# Patient Record
Sex: Female | Born: 1940 | Race: White | Hispanic: No | Marital: Married | Smoking: Never smoker
Health system: Southern US, Community
[De-identification: ages and names within clinical notes are randomized; demographics above are authoritative.]

## PROBLEM LIST (undated history)

## (undated) DIAGNOSIS — M719 Bursopathy, unspecified: Secondary | ICD-10-CM

## (undated) DIAGNOSIS — M1990 Unspecified osteoarthritis, unspecified site: Secondary | ICD-10-CM

## (undated) DIAGNOSIS — I10 Essential (primary) hypertension: Secondary | ICD-10-CM

## (undated) DIAGNOSIS — E785 Hyperlipidemia, unspecified: Secondary | ICD-10-CM

## (undated) DIAGNOSIS — Z8619 Personal history of other infectious and parasitic diseases: Secondary | ICD-10-CM

## (undated) DIAGNOSIS — K5732 Diverticulitis of large intestine without perforation or abscess without bleeding: Secondary | ICD-10-CM

## (undated) DIAGNOSIS — J45909 Unspecified asthma, uncomplicated: Secondary | ICD-10-CM

## (undated) DIAGNOSIS — K219 Gastro-esophageal reflux disease without esophagitis: Secondary | ICD-10-CM

## (undated) DIAGNOSIS — M5137 Other intervertebral disc degeneration, lumbosacral region: Secondary | ICD-10-CM

## (undated) DIAGNOSIS — N3000 Acute cystitis without hematuria: Secondary | ICD-10-CM

## (undated) DIAGNOSIS — L723 Sebaceous cyst: Secondary | ICD-10-CM

## (undated) DIAGNOSIS — M67919 Unspecified disorder of synovium and tendon, unspecified shoulder: Secondary | ICD-10-CM

## (undated) DIAGNOSIS — J309 Allergic rhinitis, unspecified: Secondary | ICD-10-CM

## (undated) HISTORY — DX: Bursopathy, unspecified: M71.9

## (undated) HISTORY — DX: Acute cystitis without hematuria: N30.00

## (undated) HISTORY — DX: Gastro-esophageal reflux disease without esophagitis: K21.9

## (undated) HISTORY — DX: Unspecified osteoarthritis, unspecified site: M19.90

## (undated) HISTORY — DX: Unspecified disorder of synovium and tendon, unspecified shoulder: M67.919

## (undated) HISTORY — PX: CHOLECYSTECTOMY: SHX55

## (undated) HISTORY — DX: Sebaceous cyst: L72.3

## (undated) HISTORY — DX: Unspecified asthma, uncomplicated: J45.909

## (undated) HISTORY — DX: Essential (primary) hypertension: I10

## (undated) HISTORY — PX: OTHER SURGICAL HISTORY: SHX169

## (undated) HISTORY — DX: Personal history of other infectious and parasitic diseases: Z86.19

## (undated) HISTORY — DX: Diverticulitis of large intestine without perforation or abscess without bleeding: K57.32

## (undated) HISTORY — DX: Hyperlipidemia, unspecified: E78.5

## (undated) HISTORY — DX: Other intervertebral disc degeneration, lumbosacral region: M51.37

## (undated) HISTORY — PX: ABDOMINAL HYSTERECTOMY: SHX81

## (undated) HISTORY — DX: Allergic rhinitis, unspecified: J30.9

---

## 1998-04-06 ENCOUNTER — Ambulatory Visit (HOSPITAL_COMMUNITY): Admission: RE | Admit: 1998-04-06 | Discharge: 1998-04-06 | Payer: Self-pay | Admitting: Gastroenterology

## 2000-04-16 ENCOUNTER — Other Ambulatory Visit: Admission: RE | Admit: 2000-04-16 | Discharge: 2000-04-16 | Payer: Self-pay | Admitting: Obstetrics and Gynecology

## 2001-12-28 ENCOUNTER — Encounter: Payer: Self-pay | Admitting: Family Medicine

## 2001-12-28 ENCOUNTER — Encounter: Admission: RE | Admit: 2001-12-28 | Discharge: 2001-12-28 | Payer: Self-pay | Admitting: Family Medicine

## 2004-04-05 ENCOUNTER — Ambulatory Visit: Payer: Self-pay | Admitting: Family Medicine

## 2004-04-12 ENCOUNTER — Ambulatory Visit: Payer: Self-pay | Admitting: Family Medicine

## 2004-05-23 ENCOUNTER — Ambulatory Visit: Payer: Self-pay | Admitting: Family Medicine

## 2004-11-09 ENCOUNTER — Ambulatory Visit: Payer: Self-pay | Admitting: Family Medicine

## 2005-03-15 ENCOUNTER — Ambulatory Visit: Payer: Self-pay | Admitting: Family Medicine

## 2005-03-18 ENCOUNTER — Ambulatory Visit: Payer: Self-pay | Admitting: Family Medicine

## 2005-03-21 ENCOUNTER — Ambulatory Visit: Payer: Self-pay | Admitting: Family Medicine

## 2005-06-14 ENCOUNTER — Ambulatory Visit: Payer: Self-pay | Admitting: Family Medicine

## 2005-06-21 ENCOUNTER — Ambulatory Visit: Payer: Self-pay | Admitting: Family Medicine

## 2005-11-25 ENCOUNTER — Ambulatory Visit: Payer: Self-pay | Admitting: Family Medicine

## 2006-01-22 ENCOUNTER — Ambulatory Visit: Payer: Self-pay | Admitting: Family Medicine

## 2006-01-22 LAB — CONVERTED CEMR LAB
ALT: 27 units/L (ref 0–40)
AST: 24 units/L (ref 0–37)
Albumin: 3.7 g/dL (ref 3.5–5.2)
Alkaline Phosphatase: 79 units/L (ref 39–117)
Bilirubin, Direct: 0.1 mg/dL (ref 0.0–0.3)
Chol/HDL Ratio, serum: 3.6
Cholesterol: 195 mg/dL (ref 0–200)
HDL: 54.4 mg/dL (ref >39.0)
LDL Cholesterol: 118 mg/dL — ABNORMAL HIGH (ref 0–99)
Total Bilirubin: 0.9 mg/dL (ref 0.3–1.2)
Total Protein: 6.4 g/dL (ref 6.0–8.3)
Triglyceride fasting, serum: 112 mg/dL (ref 0–149)
VLDL: 22 mg/dL (ref 0–40)

## 2006-02-18 ENCOUNTER — Encounter: Payer: Self-pay | Admitting: Family Medicine

## 2006-02-18 LAB — CONVERTED CEMR LAB

## 2006-02-26 ENCOUNTER — Ambulatory Visit: Payer: Self-pay | Admitting: Family Medicine

## 2006-06-03 ENCOUNTER — Ambulatory Visit: Payer: Self-pay | Admitting: Family Medicine

## 2006-06-03 LAB — CONVERTED CEMR LAB
ALT: 29 units/L (ref 0–40)
AST: 28 units/L (ref 0–37)
Albumin: 3.7 g/dL (ref 3.5–5.2)
Alkaline Phosphatase: 76 units/L (ref 39–117)
BUN: 13 mg/dL (ref 6–23)
Basophils Absolute: 0 10*3/uL (ref 0.0–0.1)
Basophils Relative: 0.5 % (ref 0.0–1.0)
Bilirubin, Direct: 0.1 mg/dL (ref 0.0–0.3)
CO2: 32 meq/L (ref 19–32)
Calcium: 9.9 mg/dL (ref 8.4–10.5)
Chloride: 106 meq/L (ref 96–112)
Cholesterol: 198 mg/dL (ref 0–200)
Creatinine, Ser: 0.8 mg/dL (ref 0.4–1.2)
Eosinophils Absolute: 0.2 10*3/uL (ref 0.0–0.6)
Eosinophils Relative: 3.8 % (ref 0.0–5.0)
GFR calc Af Amer: 93 mL/min
GFR calc non Af Amer: 77 mL/min
Glucose, Bld: 94 mg/dL (ref 70–99)
HCT: 42.7 % (ref 36.0–46.0)
HDL: 55 mg/dL (ref >39.0)
Hemoglobin: 14.8 g/dL (ref 12.0–15.0)
LDL Cholesterol: 114 mg/dL — ABNORMAL HIGH (ref 0–99)
Lymphocytes Relative: 41.9 % (ref 12.0–46.0)
MCHC: 34.7 g/dL (ref 30.0–36.0)
MCV: 83.7 fL (ref 78.0–100.0)
Monocytes Absolute: 0.5 10*3/uL (ref 0.2–0.7)
Monocytes Relative: 9 % (ref 3.0–11.0)
Neutro Abs: 2.7 10*3/uL (ref 1.4–7.7)
Neutrophils Relative %: 44.8 % (ref 43.0–77.0)
Platelets: 224 10*3/uL (ref 150–400)
Potassium: 3.8 meq/L (ref 3.5–5.1)
RBC: 5.1 M/uL (ref 3.87–5.11)
RDW: 12.9 % (ref 11.5–14.6)
Sodium: 143 meq/L (ref 135–145)
TSH: 1.5 microintl units/mL (ref 0.35–5.50)
Total Bilirubin: 0.8 mg/dL (ref 0.3–1.2)
Total CHOL/HDL Ratio: 3.6
Total Protein: 7.1 g/dL (ref 6.0–8.3)
Triglycerides: 146 mg/dL (ref 0–149)
VLDL: 29 mg/dL (ref 0–40)
WBC: 5.8 10*3/uL (ref 4.5–10.5)

## 2006-06-10 ENCOUNTER — Ambulatory Visit: Payer: Self-pay | Admitting: Family Medicine

## 2006-06-11 ENCOUNTER — Ambulatory Visit: Payer: Self-pay | Admitting: Family Medicine

## 2006-06-12 ENCOUNTER — Encounter: Admission: RE | Admit: 2006-06-12 | Discharge: 2006-06-12 | Payer: Self-pay | Admitting: Family Medicine

## 2006-06-21 IMAGING — CR DG HIP W/ PELVIS BILAT
5 series · 5 of 5 positions shown · non-contrast
Comparison: none

CLINICAL DATA: Six month bilateral hip pain without trauma. 
BILATERAL HIPS WITH PELVIS FIVE VIEWS:

[view not recorded (1 of 5)]
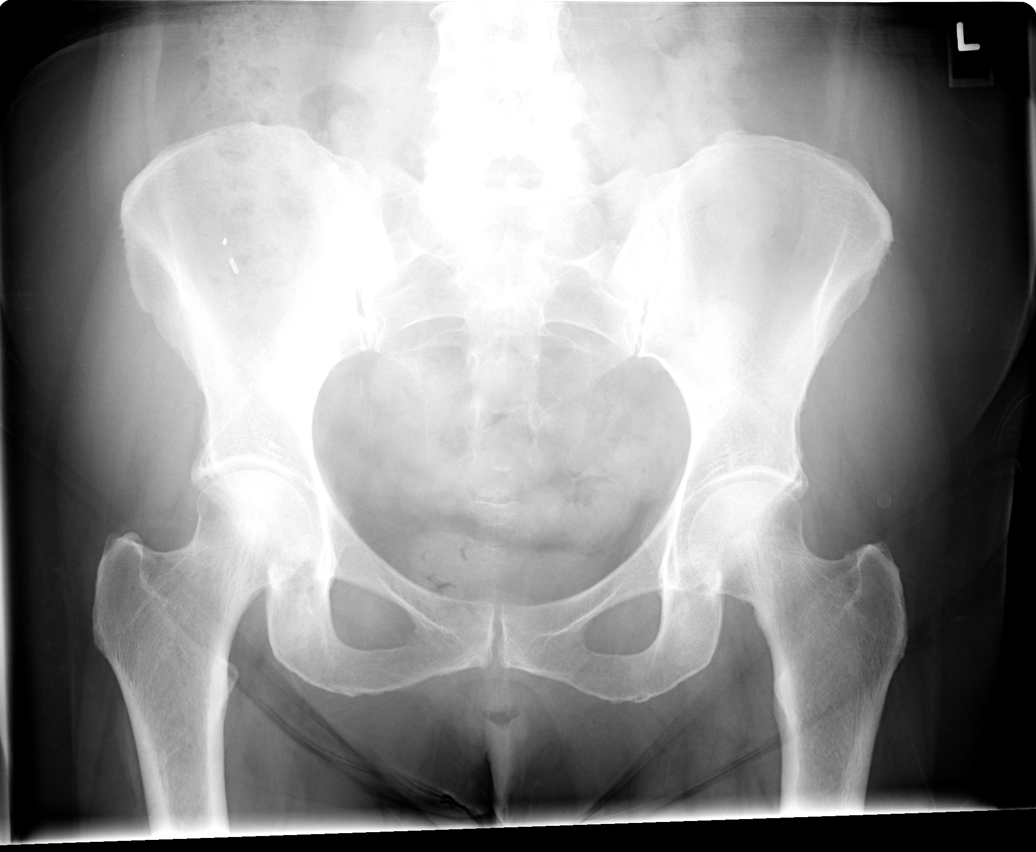

[view not recorded (2 of 5)]
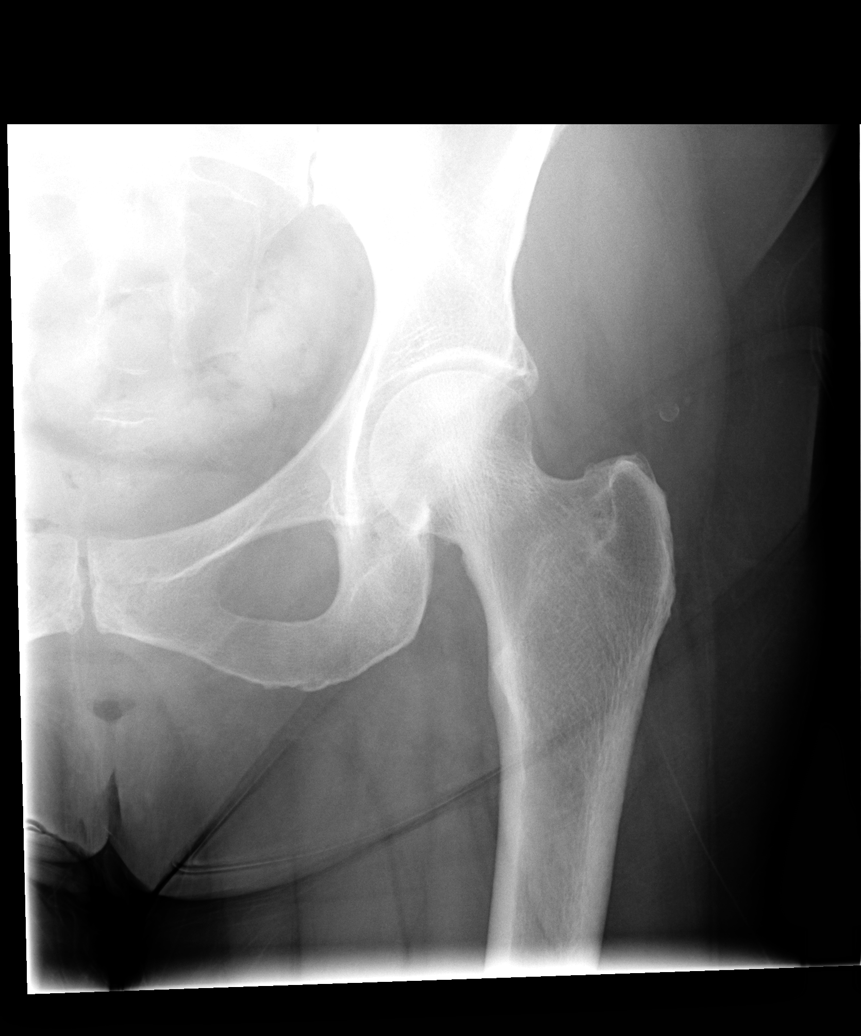

[view not recorded (3 of 5)]
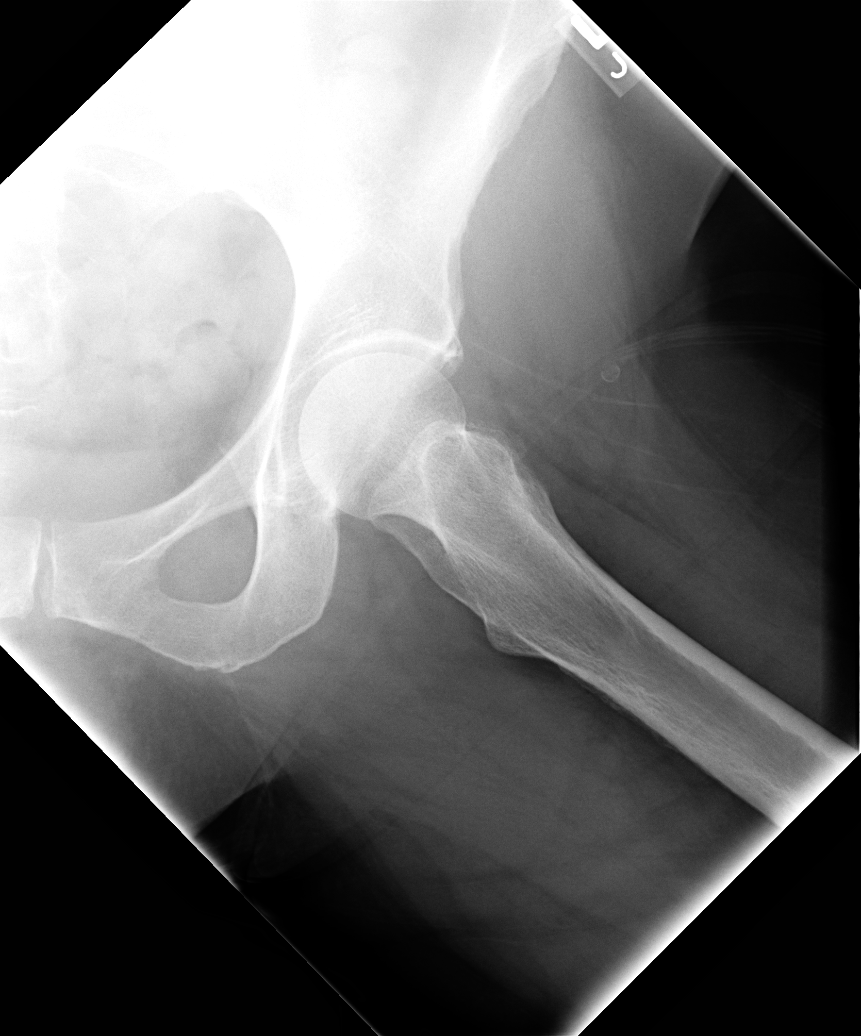

[view not recorded (4 of 5)]
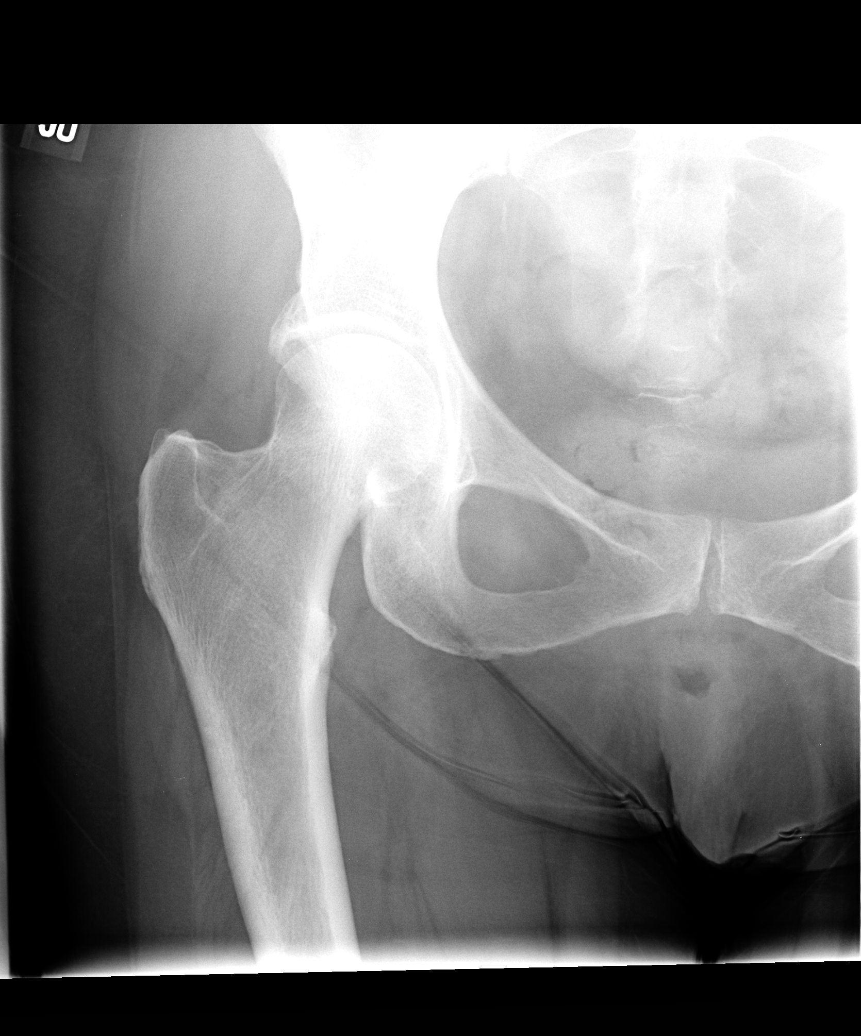

[view not recorded (5 of 5)]
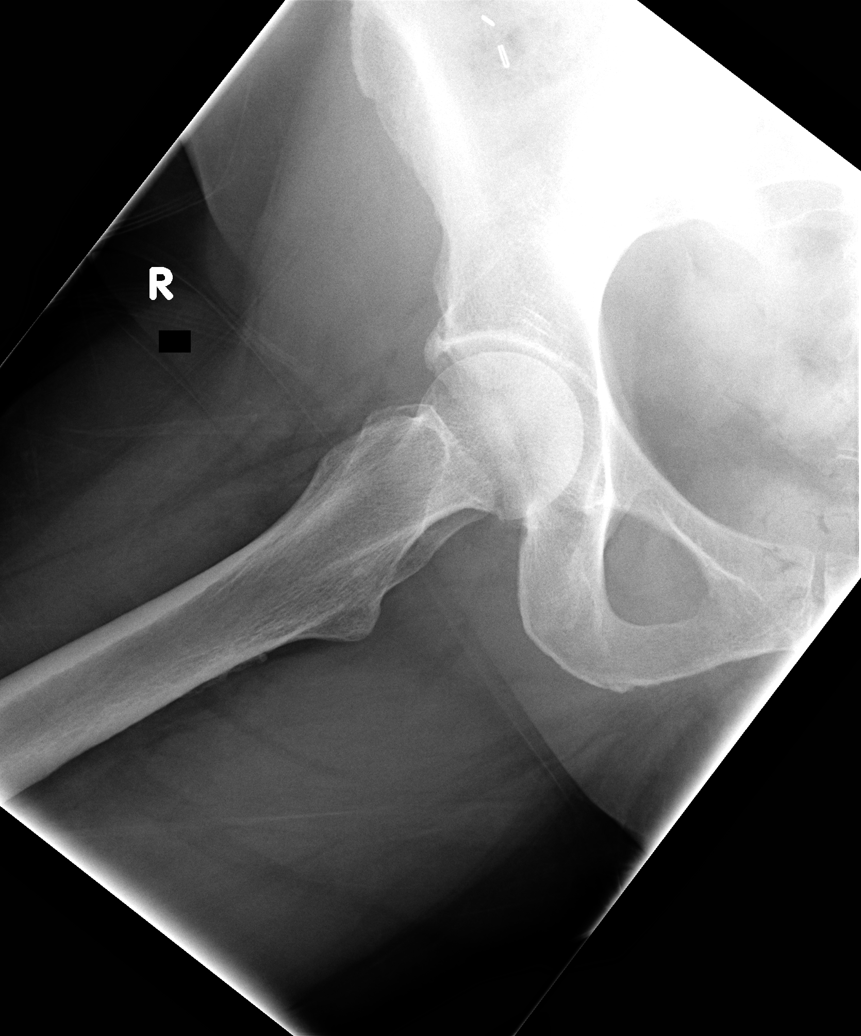

[5 of 5 positions shown; findings below may reference images not displayed]

FINDINGS: Slight bilateral superior degenerative joint space narrowing is seen at the hips with no other significant osseous, articular, nor soft tissue abnormality is seen.  Surgical clips are seen at the right lower quadrant.   Bilateral sacroiliac joints are unremarkable.
IMPRESSION: 1.  Mild degenerative narrowing superior bilateral hip joints. 
2.  Otherwise no significant abnormality.

## 2006-08-14 ENCOUNTER — Encounter: Payer: Self-pay | Admitting: Family Medicine

## 2006-08-14 DIAGNOSIS — K219 Gastro-esophageal reflux disease without esophagitis: Secondary | ICD-10-CM

## 2006-08-14 DIAGNOSIS — J309 Allergic rhinitis, unspecified: Secondary | ICD-10-CM | POA: Insufficient documentation

## 2006-08-14 DIAGNOSIS — I10 Essential (primary) hypertension: Secondary | ICD-10-CM

## 2006-08-14 DIAGNOSIS — Z8619 Personal history of other infectious and parasitic diseases: Secondary | ICD-10-CM

## 2006-08-14 DIAGNOSIS — M1990 Unspecified osteoarthritis, unspecified site: Secondary | ICD-10-CM

## 2006-08-14 HISTORY — DX: Essential (primary) hypertension: I10

## 2006-08-14 HISTORY — DX: Personal history of other infectious and parasitic diseases: Z86.19

## 2006-08-14 HISTORY — DX: Gastro-esophageal reflux disease without esophagitis: K21.9

## 2006-08-14 HISTORY — DX: Allergic rhinitis, unspecified: J30.9

## 2006-08-14 HISTORY — DX: Unspecified osteoarthritis, unspecified site: M19.90

## 2007-03-23 DIAGNOSIS — J069 Acute upper respiratory infection, unspecified: Secondary | ICD-10-CM | POA: Insufficient documentation

## 2007-03-30 ENCOUNTER — Ambulatory Visit: Payer: Self-pay | Admitting: Family Medicine

## 2007-03-30 DIAGNOSIS — J45909 Unspecified asthma, uncomplicated: Secondary | ICD-10-CM

## 2007-03-30 HISTORY — DX: Unspecified asthma, uncomplicated: J45.909

## 2007-06-26 ENCOUNTER — Telehealth (INDEPENDENT_AMBULATORY_CARE_PROVIDER_SITE_OTHER): Payer: Self-pay | Admitting: *Deleted

## 2007-07-01 ENCOUNTER — Encounter (INDEPENDENT_AMBULATORY_CARE_PROVIDER_SITE_OTHER): Payer: Self-pay | Admitting: *Deleted

## 2007-07-22 ENCOUNTER — Ambulatory Visit: Payer: Self-pay | Admitting: Family Medicine

## 2007-07-22 DIAGNOSIS — N3000 Acute cystitis without hematuria: Secondary | ICD-10-CM

## 2007-07-22 HISTORY — DX: Acute cystitis without hematuria: N30.00

## 2008-04-05 ENCOUNTER — Encounter: Payer: Self-pay | Admitting: Family Medicine

## 2008-05-17 ENCOUNTER — Encounter (INDEPENDENT_AMBULATORY_CARE_PROVIDER_SITE_OTHER): Payer: Self-pay | Admitting: *Deleted

## 2008-06-07 DIAGNOSIS — K5732 Diverticulitis of large intestine without perforation or abscess without bleeding: Secondary | ICD-10-CM

## 2008-06-07 HISTORY — DX: Diverticulitis of large intestine without perforation or abscess without bleeding: K57.32

## 2008-06-08 ENCOUNTER — Ambulatory Visit: Payer: Self-pay | Admitting: Family Medicine

## 2008-06-10 ENCOUNTER — Ambulatory Visit: Payer: Self-pay | Admitting: Family Medicine

## 2008-06-13 ENCOUNTER — Telehealth: Payer: Self-pay | Admitting: Family Medicine

## 2008-06-28 ENCOUNTER — Encounter: Payer: Self-pay | Admitting: Family Medicine

## 2008-06-28 ENCOUNTER — Ambulatory Visit: Payer: Self-pay | Admitting: Family Medicine

## 2008-07-01 ENCOUNTER — Ambulatory Visit: Payer: Self-pay | Admitting: Family Medicine

## 2008-07-01 DIAGNOSIS — E785 Hyperlipidemia, unspecified: Secondary | ICD-10-CM

## 2008-07-01 HISTORY — DX: Hyperlipidemia, unspecified: E78.5

## 2008-07-13 ENCOUNTER — Ambulatory Visit: Payer: Self-pay | Admitting: Internal Medicine

## 2008-07-15 ENCOUNTER — Encounter: Payer: Self-pay | Admitting: Family Medicine

## 2008-07-15 ENCOUNTER — Ambulatory Visit: Payer: Self-pay | Admitting: Family Medicine

## 2008-07-20 DIAGNOSIS — L723 Sebaceous cyst: Secondary | ICD-10-CM

## 2008-07-20 HISTORY — DX: Sebaceous cyst: L72.3

## 2008-07-27 ENCOUNTER — Ambulatory Visit: Payer: Self-pay | Admitting: Internal Medicine

## 2008-07-27 ENCOUNTER — Encounter: Payer: Self-pay | Admitting: Internal Medicine

## 2008-07-29 ENCOUNTER — Encounter: Payer: Self-pay | Admitting: Internal Medicine

## 2008-08-05 LAB — HM COLONOSCOPY

## 2008-10-31 ENCOUNTER — Encounter: Payer: Self-pay | Admitting: Family Medicine

## 2008-11-11 ENCOUNTER — Ambulatory Visit: Payer: Self-pay | Admitting: Family Medicine

## 2008-11-11 DIAGNOSIS — M719 Bursopathy, unspecified: Secondary | ICD-10-CM

## 2008-11-11 DIAGNOSIS — M67919 Unspecified disorder of synovium and tendon, unspecified shoulder: Secondary | ICD-10-CM | POA: Insufficient documentation

## 2008-11-11 HISTORY — DX: Unspecified disorder of synovium and tendon, unspecified shoulder: M67.919

## 2009-03-07 ENCOUNTER — Telehealth: Payer: Self-pay | Admitting: Family Medicine

## 2009-04-10 ENCOUNTER — Encounter: Payer: Self-pay | Admitting: Family Medicine

## 2009-07-25 ENCOUNTER — Ambulatory Visit: Payer: Self-pay | Admitting: Family Medicine

## 2009-08-07 ENCOUNTER — Telehealth: Payer: Self-pay | Admitting: Family Medicine

## 2009-08-07 DIAGNOSIS — M5137 Other intervertebral disc degeneration, lumbosacral region: Secondary | ICD-10-CM

## 2009-08-07 DIAGNOSIS — M51379 Other intervertebral disc degeneration, lumbosacral region without mention of lumbar back pain or lower extremity pain: Secondary | ICD-10-CM

## 2009-08-07 HISTORY — DX: Other intervertebral disc degeneration, lumbosacral region without mention of lumbar back pain or lower extremity pain: M51.379

## 2009-08-07 HISTORY — DX: Other intervertebral disc degeneration, lumbosacral region: M51.37

## 2009-08-10 ENCOUNTER — Ambulatory Visit: Payer: Self-pay | Admitting: Family Medicine

## 2010-02-20 ENCOUNTER — Telehealth: Payer: Self-pay | Admitting: Family Medicine

## 2010-03-18 LAB — CONVERTED CEMR LAB
ALT: 23 units/L (ref 0–35)
AST: 25 units/L (ref 0–37)
Albumin: 3.8 g/dL (ref 3.5–5.2)
Alkaline Phosphatase: 70 units/L (ref 39–117)
Alkaline Phosphatase: 71 units/L (ref 39–117)
BUN: 11 mg/dL (ref 6–23)
BUN: 15 mg/dL (ref 6–23)
Basophils Absolute: 0 10*3/uL (ref 0.0–0.1)
Basophils Absolute: 0 10*3/uL (ref 0.0–0.1)
Basophils Relative: 0.5 % (ref 0.0–3.0)
Basophils Relative: 0.7 % (ref 0.0–3.0)
Bilirubin Urine: NEGATIVE
Bilirubin Urine: NEGATIVE
Bilirubin Urine: NEGATIVE
Bilirubin, Direct: 0 mg/dL (ref 0.0–0.3)
Bilirubin, Direct: 0.1 mg/dL (ref 0.0–0.3)
Blood in Urine, dipstick: NEGATIVE
Blood in Urine, dipstick: NEGATIVE
CO2: 31 meq/L (ref 19–32)
CO2: 32 meq/L (ref 19–32)
Calcium: 9.8 mg/dL (ref 8.4–10.5)
Calcium: 9.9 mg/dL (ref 8.4–10.5)
Chloride: 103 meq/L (ref 96–112)
Cholesterol: 187 mg/dL (ref 0–200)
Cholesterol: 277 mg/dL — ABNORMAL HIGH (ref 0–200)
Creatinine, Ser: 0.7 mg/dL (ref 0.4–1.2)
Creatinine, Ser: 0.7 mg/dL (ref 0.4–1.2)
Direct LDL: 191.6 mg/dL
Eosinophils Absolute: 0.2 10*3/uL (ref 0.0–0.7)
Eosinophils Absolute: 0.2 10*3/uL (ref 0.0–0.7)
Eosinophils Relative: 3.5 % (ref 0.0–5.0)
GFR calc non Af Amer: 88.46 mL/min (ref >60)
Glucose, Bld: 85 mg/dL (ref 70–99)
Glucose, Urine, Semiquant: NEGATIVE
Glucose, Urine, Semiquant: NEGATIVE
Glucose, Urine, Semiquant: NEGATIVE
HCT: 39.7 % (ref 36.0–46.0)
HDL: 45.7 mg/dL (ref >39.00)
Hemoglobin: 13.9 g/dL (ref 12.0–15.0)
Ketones, urine, test strip: NEGATIVE
Ketones, urine, test strip: NEGATIVE
Ketones, urine, test strip: NEGATIVE
LDL Cholesterol: 109 mg/dL — ABNORMAL HIGH (ref 0–99)
Lymphocytes Relative: 31.6 % (ref 12.0–46.0)
Lymphocytes Relative: 40.6 % (ref 12.0–46.0)
Lymphs Abs: 2 10*3/uL (ref 0.7–4.0)
MCHC: 34.7 g/dL (ref 30.0–36.0)
MCHC: 34.9 g/dL (ref 30.0–36.0)
MCV: 86.4 fL (ref 78.0–100.0)
Monocytes Absolute: 0.5 10*3/uL (ref 0.1–1.0)
Monocytes Relative: 7.7 % (ref 3.0–12.0)
Neutro Abs: 3.6 10*3/uL (ref 1.4–7.7)
Neutrophils Relative %: 47.8 % (ref 43.0–77.0)
Neutrophils Relative %: 56.7 % (ref 43.0–77.0)
Nitrite: NEGATIVE
Nitrite: NEGATIVE
Nitrite: NEGATIVE
Platelets: 208 10*3/uL (ref 150.0–400.0)
Platelets: 225 10*3/uL (ref 150.0–400.0)
Potassium: 4 meq/L (ref 3.5–5.1)
Protein, U semiquant: NEGATIVE
Protein, U semiquant: NEGATIVE
RBC: 4.59 M/uL (ref 3.87–5.11)
RBC: 4.81 M/uL (ref 3.87–5.11)
RDW: 12.8 % (ref 11.5–14.6)
RDW: 13.9 % (ref 11.5–14.6)
Sodium: 141 meq/L (ref 135–145)
Specific Gravity, Urine: 1.01
Specific Gravity, Urine: 1.01
Specific Gravity, Urine: 1.015
TSH: 1.11 microintl units/mL (ref 0.35–5.50)
Total Bilirubin: 0.8 mg/dL (ref 0.3–1.2)
Total Bilirubin: 0.8 mg/dL (ref 0.3–1.2)
Total CHOL/HDL Ratio: 4
Total CHOL/HDL Ratio: 5
Total Protein: 7.1 g/dL (ref 6.0–8.3)
Triglycerides: 107 mg/dL (ref 0.0–149.0)
Triglycerides: 161 mg/dL — ABNORMAL HIGH (ref 0.0–149.0)
Urobilinogen, UA: 0.2
Urobilinogen, UA: 0.2
Urobilinogen, UA: 0.2
VLDL: 32.2 mg/dL (ref 0.0–40.0)
WBC Urine, dipstick: NEGATIVE
WBC: 6.3 10*3/uL (ref 4.5–10.5)
pH: 5
pH: 7
pH: 7

## 2010-03-20 NOTE — Progress Notes (Signed)
Summary: Needs Written Rx for Potassium  Phone Note Call from Patient   Summary of Call: Pt is switching to mail order pharmacy and needs a new prescription written for potassium 20 mg (needs hard copy).  Please call when ready to pick up. Initial call taken by: Trixie Dredge,  March 07, 2009 9:42 AM    Prescriptions: POTASSIUM CHLORIDE CRYS CR 20 MEQ  TBCR (POTASSIUM CHLORIDE CRYS CR) one every morning  #100 x 3   Entered by:   Kern Reap CMA (AAMA)   Authorized by:   Roderick Pee MD   Signed by:   Kern Reap CMA (AAMA) on 03/07/2009   Method used:   Faxed to ...       [facility] (mail-order)             , [place]         Ph: [phone]       Fax: [phone]   RxID:   [id number]

## 2010-03-20 NOTE — Assessment & Plan Note (Signed)
Summary: CPX, WILL COME IN FASTING//SLM   Vital Signs:  Patient profile:   70 year old female Menstrual status:  hysterectomy Height:      65 inches Weight:      158 pounds BMI:     26.39 Temp:     97.7 degrees F oral BP sitting:   120 / 90  (left arm) Cuff size:   regular  Vitals Entered By: Kathrynn Speed CMA (July 25, 2009 8:28 AM)  Nutrition Counseling: Patient's BMI is greater than 25 and therefore counseled on weight management options. CC: cpx   CC:  cpx.  History of Present Illness: Desiree Spencer is a 70 year old, married female, nonsmoker, who comes in today for evaluation of hypertension, hyperlipidemia.  She has long-standing hypertension, treated with hydrochlorothiazide 25 mg daily, and one potassium supplement.  BP 120/90.  She stopped her Zocor because it was making her hair fall out.  She also takes Zantac 150 OTC for reflux.  Here for Medicare AWV:  1.   Risk factors based on Past M, S, F history:..........Marland Kitchenreviewed.  No change 2.   Physical Activities: ............no   Exercise on a regular basis 3.   Depression/mood: ,,,,,,,,,,mood is good.  No depression 4.   Hearing: ..........normal 5.   ADL's:...........normal 6.   Fall Risk: .............Marland Kitchenreviewed 7.   Home Safety: ..........Marland Kitchenreviewed 8.   Height, weight, &visual acuity:..............Marland Kitchenheight weight, normal annual eye exam by ophthalmologist 9.   Counseling: ..............she was counseled to start an exercise program 10.   Labs ordered based on risk factors: ..........done 11.           Referral Coordination....................n/a 12.           Care Plan......... see above.  She does get regular dental care, BSE monthly, annual mammography, colonoscopy 2008 normal, tetanus, 2008, seasonal flu 2010, Pneumovax 2008, shingles.  Vaccine given last year 13.            Cognitive Assessment ............normal........ she does have a living will  Current Medications (verified): 1)  Aspir-81 81 Mg Tbec  (Aspirin) .... Take 1 Tablet By Mouth Once A Day 2)  Omega-3 1000 Mg Caps (Omega-3 Fatty Acids) .... Take 1 Once A Day 3)  Zantac 150 Mg Caps (Ranitidine Hcl) .... Take 1 Capsule By Mouth Once A Day 4)  Hydrochlorothiazide 25 Mg  Tabs (Hydrochlorothiazide) .... Take Once Daily 5)  Potassium Chloride Crys Cr 20 Meq  Tbcr (Potassium Chloride Crys Cr) .... One Every Morning 6)  Zyrtec Allergy 10 Mg  Tabs (Cetirizine Hcl) .... Once Daily As Needed 7)  Magnesium Oxide 400 Mg Caps (Magnesium Oxide) .... Once Daily 8)  Caltrate 600+d 600-400 Mg-Unit Tabs (Calcium Carbonate-Vitamin D) .... Take 1 Tablet By Mouth Two Times A Day  Allergies (verified): No Known Drug Allergies  Past History:  Past medical, surgical, family and social histories (including risk factors) reviewed, and no changes noted (except as noted below).  Past Medical History: Reviewed history from 08/14/2006 and no changes required. PMS MHA Allergic rhinitis MVP TENNITIS CHRONIC CONSTIPATION GERD Osteoarthritis-HIPS Hypertension  Past Surgical History: Reviewed history from 08/14/2006 and no changes required. CB X2  Cholecystectomy TAH/BSO  Family History: Reviewed history from 07/01/2008 and no changes required. Family History High cholesterol Family History Hypertension  Social History: Reviewed history from 07/22/2007 and no changes required. Retired Married Never Smoked Alcohol use-no Drug use-no Regular exercise-yes  Review of Systems      See HPI  Physical Exam  General:  USAA  no acute distress; alert,appropriate and cooperative throughout examination Head:  Normocephalic and atraumatic without obvious abnormalities. No apparent alopecia or balding. Eyes:  No corneal or conjunctival inflammation noted. EOMI. Perrla. Funduscopic exam benign, without hemorrhages, exudates or papilledema. Vision grossly normal. Ears:  External ear exam shows no significant lesions or  deformities.  Otoscopic examination reveals clear canals, tympanic membranes are intact bilaterally without bulging, retraction, inflammation or discharge. Hearing is grossly normal bilaterally. Nose:  External nasal examination shows no deformity or inflammation. Nasal mucosa are pink and moist without lesions or exudates. Mouth:  Oral mucosa and oropharynx without lesions or exudates.  Teeth in good repair. Neck:  No deformities, masses, or tenderness noted. Chest Wall:  No deformities, masses, or tenderness noted. Breasts:  No mass, nodules, thickening, tenderness, bulging, retraction, inflamation, nipple discharge or skin changes noted.   Lungs:  Normal respiratory effort, chest expands symmetrically. Lungs are clear to auscultation, no crackles or wheezes. Heart:  Normal rate and regular rhythm. S1 and S2 normal without gallop, murmur, click, rub or other extra sounds. Abdomen:  Bowel sounds positive,abdomen soft and non-tender without masses, organomegaly or hernias noted. Rectal:  No external abnormalities noted. Normal sphincter tone. No rectal masses or tenderness. Genitalia:  Pelvic Exam:        External: normal female genitalia without lesions or masses        Vagina: normal without lesions or masses        Cervix: normal without lesions or masses        Adnexa: normal bimanual exam without masses or fullness        Uterus: normal by palpation        Pap smear: not performed Msk:  No deformity or scoliosis noted of thoracic or lumbar spine.   Pulses:  R and L carotid,radial,femoral,dorsalis pedis and posterior tibial pulses are full and equal bilaterally Extremities:  No clubbing, cyanosis, edema, or deformity noted with normal full range of motion of all joints.   Neurologic:  No cranial nerve deficits noted. Station and gait are normal. Plantar reflexes are down-going bilaterally. DTRs are symmetrical throughout. Sensory, motor and coordinative functions appear intact. Skin:  Intact  without suspicious lesions or rashes Cervical Nodes:  No lymphadenopathy noted Axillary Nodes:  No palpable lymphadenopathy Inguinal Nodes:  No significant adenopathy Psych:  Cognition and judgment appear intact. Alert and cooperative with normal attention span and concentration. No apparent delusions, illusions, hallucinations   Impression & Recommendations:  Problem # 1:  ROUTINE GENERAL MEDICAL EXAM@HEALTH  CARE FACL (ICD-V70.0) Assessment Unchanged  Orders: Venipuncture (16109) TLB-Lipid Panel (80061-LIPID) TLB-BMP (Basic Metabolic Panel-BMET) (80048-METABOL) TLB-CBC Platelet - w/Differential (85025-CBCD) TLB-Hepatic/Liver Function Pnl (80076-HEPATIC) TLB-TSH (Thyroid Stimulating Hormone) (84443-TSH)  Problem # 2:  HYPERTENSION (ICD-401.9) Assessment: Improved  The following medications were removed from the medication list:    Hydrochlorothiazide 25 Mg Tabs (Hydrochlorothiazide) .Marland Kitchen... Take once daily Her updated medication list for this problem includes:    Dyazide 37.5-25 Mg Caps (Triamterene-hctz) .Marland Kitchen... Take 1 tablet by mouth every morning  Orders: Venipuncture (60454) Prescription Created Electronically [id number]) First annual wellness visit with prevention plan  (B1478) UA Dipstick w/o Micro (automated)  (81003) EKG w/ Interpretation (93000) TLB-Lipid Panel (80061-LIPID) TLB-BMP (Basic Metabolic Panel-BMET) (80048-METABOL) TLB-CBC Platelet - w/Differential (85025-CBCD) TLB-Hepatic/Liver Function Pnl (80076-HEPATIC) TLB-TSH (Thyroid Stimulating Hormone) (84443-TSH)  Problem # 3:  GERD (ICD-530.81) Assessment: Unchanged  Her updated medication list for this problem includes:    Zantac 150 Mg Caps (Ranitidine hcl) .Marland Kitchen... Take 1 capsule  by mouth once a day    Magnesium Oxide 400 Mg Caps (Magnesium oxide) ..... Once daily  Complete Medication List: 1)  Aspir-81 81 Mg Tbec (Aspirin) .... Take 1 tablet by mouth once a day 2)  Omega-3 1000 Mg Caps (Omega-3 fatty acids)  .... Take 1 once a day 3)  Zantac 150 Mg Caps (Ranitidine hcl) .... Take 1 capsule by mouth once a day 4)  Zyrtec Allergy 10 Mg Tabs (Cetirizine hcl) .... Once daily as needed 5)  Magnesium Oxide 400 Mg Caps (Magnesium oxide) .... Once daily 6)  Caltrate 600+d 600-400 Mg-unit Tabs (Calcium carbonate-vitamin d) .... Take 1 tablet by mouth two times a day 7)  Dyazide 37.5-25 Mg Caps (Triamterene-hctz) .... Take 1 tablet by mouth every morning 8)  Imitrex 25 Mg Tabs (Sumatriptan succinate) .... Uad for migraines  Patient Instructions: 1)  continue your current medications.  Except stop the hydrochlorothiazide and potassium and take one Dyazide daily. 2)  Begin a walking program 20 minutes daily 3)  Please schedule a follow-up appointment in 1 year. 4)  Schedule your mammogram. 5)  Schedule a colonoscopy/sigmoidoscopy to help detect colon cancer. 6)  Take an Aspirin every day. Prescriptions: IMITREX 25 MG TABS (SUMATRIPTAN SUCCINATE) UAD for migraines  #6 x 11   Entered and Authorized by:   Roderick Pee MD   Signed by:   Roderick Pee MD on 07/25/2009   Method used:   Print then Give to Patient   RxID:   [id number] DYAZIDE 37.5-25 MG CAPS (TRIAMTERENE-HCTZ) Take 1 tablet by mouth every morning  #100 x 3   Entered and Authorized by:   Roderick Pee MD   Signed by:   Roderick Pee MD on 07/25/2009   Method used:   Print then Give to Patient   RxID:   [id number] QIONGEX 37.5-25 MG CAPS (TRIAMTERENE-HCTZ) Take 1 tablet by mouth every morning  #100 x 3   Entered and Authorized by:   Roderick Pee MD   Signed by:   Roderick Pee MD on 07/25/2009   Method used:   Electronically to        [facility]  [phone]* (retail)       [address]       [place], [place]  [address]       Ph: [phone] or [phone]       Fax: [phone]   RxID:   [id number]     Laboratory Results   Urine Tests    Routine Urinalysis   Color:  yellow Appearance: Clear Glucose: negative   (Normal Range: Negative) Bilirubin: negative   (Normal Range: Negative) Ketone: negative   (Normal Range: Negative) Spec. Gravity: 1.015   (Normal Range: 1.003-1.035) Blood: negative   (Normal Range: Negative) pH: 7.0   (Normal Range: 5.0-8.0) Protein: negative   (Normal Range: Negative) Urobilinogen: 0.2   (Normal Range: 0-1) Nitrite: negative   (Normal Range: Negative) Leukocyte Esterace: negative   (Normal Range: Negative)    Comments: Rita Ohara  July 25, 2009 11:38 AM

## 2010-03-20 NOTE — Miscellaneous (Signed)
Summary: mammogram update  Clinical Lists Changes  Observations: Added new observation of MAMMOGRAM: normal (04/06/2009 12:16)      Preventive Care Screening  Mammogram:    Date:  04/06/2009    Results:  normal

## 2010-03-20 NOTE — Progress Notes (Signed)
Summary: Pt says [facility] out of Dyazide caps,req tabs.Also need lab results  Phone Note Call from Patient Call back at Home Phone [phone]   Caller: Patient Summary of Call: Pt called and said that she sent script for Dyazide caps to [facility] and was told that they were out of caps and could fill with tabs. Need doctors approval. Req was sent, but no response. Pls call [facility] [phone]. Pt also req lab results.  Initial call taken by: Lucy Antigua,  August 07, 2009 10:11 AM  Follow-up for Phone Call        spoke with pharmacy Follow-up by: Kern Reap CMA Duncan Dull),  August 08, 2009 10:43 AM    New/Updated Medications: MAXZIDE-25 37.5-25 MG TABS (TRIAMTERENE-HCTZ) take one tab by mouth every morning

## 2010-03-20 NOTE — Assessment & Plan Note (Signed)
Summary: consult re: hip pain/cjr   Vital Signs:  Patient profile:   70 year old female Menstrual status:  hysterectomy BP sitting:   120 / 84  (left arm) Cuff size:   regular CC: right hip pain   CC:  right hip pain.  History of Present Illness: Desiree Spencer is a 70 year old female, who comes in today for evaluation of back pain.  She states on Monday 4 days ago.  She had the sudden onset of severe right lower back pain.  At the time she was walking.  Since then, the pain has changed in character.  Now........a  dull ache that comes and goes.  It is very positional.  It does not wake her up at night.  no bowel or bladder dysfunction, pain.  Her pain on a scale of one to 10 is 7.  It radiates down to her right buttocks.  No further.  She denies any neurologic symptoms.  She had an episode like this in April that lasted 3 weeks.  It went away however, the pain was not this bad.  No previous history of back problems, no trauma  Allergies: No Known Drug Allergies  Past History:  Past medical, surgical, family and social histories (including risk factors) reviewed for relevance to current acute and chronic problems.  Past Medical History: Reviewed history from 08/14/2006 and no changes required. PMS MHA Allergic rhinitis MVP TENNITIS CHRONIC CONSTIPATION GERD Osteoarthritis-HIPS Hypertension  Past Surgical History: Reviewed history from 08/14/2006 and no changes required. CB X2  Cholecystectomy TAH/BSO  Family History: Reviewed history from 07/01/2008 and no changes required. Family History High cholesterol Family History Hypertension  Social History: Reviewed history from 07/22/2007 and no changes required. Retired Married Never Smoked Alcohol use-no Drug use-no Regular exercise-yes  Review of Systems      See HPI  Physical Exam  General:  Well-developed,well-nourished,in no acute distress; alert,appropriate and cooperative throughout examination Msk:  No  deformity or scoliosis noted of thoracic or lumbar spine.   Pulses:  R and L carotid,radial,femoral,dorsalis pedis and posterior tibial pulses are full and equal bilaterally Extremities:  No clubbing, cyanosis, edema, or deformity noted with normal full range of motion of all joints.   Neurologic:  No cranial nerve deficits noted. Station and gait are normal. Plantar reflexes are down-going bilaterally. DTRs are symmetrical throughout. Sensory, motor and coordinative functions appear intact....Marland KitchenMarland Kitchenpositive straight leg raising right at 30 degrees   Impression & Recommendations:  Problem # 1:  DISC DISEASE, LUMBAR (ICD-722.52) Assessment New  Complete Medication List: 1)  Aspir-81 81 Mg Tbec (Aspirin) .... Take 1 tablet by mouth once a day 2)  Omega-3 1000 Mg Caps (Omega-3 fatty acids) .... Take 1 once a day 3)  Zantac 150 Mg Caps (Ranitidine hcl) .... Take 1 capsule by mouth once a day 4)  Zyrtec Allergy 10 Mg Tabs (Cetirizine hcl) .... Once daily as needed 5)  Magnesium Oxide 400 Mg Caps (Magnesium oxide) .... Once daily 6)  Caltrate 600+d 600-400 Mg-unit Tabs (Calcium carbonate-vitamin d) .... Take 1 tablet by mouth two times a day 7)  Imitrex 25 Mg Tabs (Sumatriptan succinate) .... Uad for migraines 8)  Maxzide-25 37.5-25 Mg Tabs (Triamterene-hctz) .... Take one tab by mouth every morning 9)  Flexeril 10 Mg Tabs (Cyclobenzaprine hcl) .... Take 1 tablet by mouth three times a day as needed 10)  Percocet 5-325 Mg Tabs (Oxycodone-acetaminophen) .... Take 1 tablet by mouth three times a day as needed pain  Patient Instructions: 1)  take Motrin, 600 mg twice daily with food.  Also one half Percocet and Flexeril 3 times a day while unit bed rest for the next two days, then take after Flexeril, and Vicodin at bedtime as needed. 2)  Saturday walk........ lie down......... walk lie........down.  Avoid sitting 3)  if we see you in the next week to 10 days.  The pain goes away, then we will all be  happy......... if not call us.  We will get u  set up for physical therapy Prescriptions: PERCOCET 5-325 MG TABS (OXYCODONE-ACETAMINOPHEN) Take 1 tablet by mouth three times a day as needed pain  #30 x 0   Entered and Authorized by:   Roderick Pee MD   Signed by:   Roderick Pee MD on 08/10/2009   Method used:   Print then Give to Patient   RxID:   [id number] VICODIN ES 7.5-750 MG TABS (HYDROCODONE-ACETAMINOPHEN) Take 1 tablet by mouth three times a day  #30 x 1   Entered and Authorized by:   Roderick Pee MD   Signed by:   Roderick Pee MD on 08/10/2009   Method used:   Print then Give to Patient   RxID:   [id number] FLEXERIL 10 MG TABS (CYCLOBENZAPRINE HCL) Take 1 tablet by mouth three times a day as needed  #30 x 1   Entered and Authorized by:   Roderick Pee MD   Signed by:   Roderick Pee MD on 08/10/2009   Method used:   Print then Give to Patient   RxID:   [id number]

## 2010-03-22 NOTE — Progress Notes (Signed)
Summary: Pt req to change from Maxzide to HCTZ due to side effects  Phone Note Call from Patient Call back at Home Phone [phone]   Caller: Patient Summary of Call: Pt called and is req a change of med, from Maxzide to HCTZ, due to side affects. Pls call in to [facility] on [address].  Initial call taken by: Lucy Antigua,  February 20, 2010 2:30 PM  Follow-up for Phone Call        patient is aware that dr todd is out of office until thursday Follow-up by: Kern Reap CMA Duncan Dull),  February 20, 2010 2:52 PM  Additional Follow-up for Phone Call Additional follow up Details #1::        ok Additional Follow-up by: Roderick Pee MD,  February 22, 2010 7:47 AM    Additional Follow-up for Phone Call Additional follow up Details #2::    HCTZ 25mg ...........dispense 100 tablets directions one q.a.m., refills x 3 Follow-up by: Kern Reap CMA (AAMA),  February 22, 2010 12:29 PM  New/Updated Medications: HYDROCHLOROTHIAZIDE 25 MG TABS (HYDROCHLOROTHIAZIDE) take one tab in the morning Prescriptions: HYDROCHLOROTHIAZIDE 25 MG TABS (HYDROCHLOROTHIAZIDE) take one tab in the morning  #100 x 3   Entered by:   Kern Reap CMA (AAMA)   Authorized by:   Roderick Pee MD   Signed by:   Kern Reap CMA (AAMA) on 02/22/2010   Method used:   Electronically to        [facility]  [phone]* (retail)       [address]       [place], [place]  [address]       Ph: [phone] or [phone]       Fax: [phone]   RxID:   [id number]

## 2010-07-06 ENCOUNTER — Encounter: Payer: Self-pay | Admitting: Family Medicine

## 2010-07-06 ENCOUNTER — Ambulatory Visit (INDEPENDENT_AMBULATORY_CARE_PROVIDER_SITE_OTHER): Payer: PRIVATE HEALTH INSURANCE | Admitting: Family Medicine

## 2010-07-06 VITALS — BP 164/94 | Temp 98.5°F | Ht 64.75 in | Wt 163.0 lb

## 2010-07-06 DIAGNOSIS — R300 Dysuria: Secondary | ICD-10-CM

## 2010-07-06 LAB — POCT URINALYSIS DIPSTICK
Bilirubin, UA: NEGATIVE
Glucose, UA: NEGATIVE
Ketones, UA: NEGATIVE
Nitrite, UA: NEGATIVE
pH, UA: 6

## 2010-07-06 MED ORDER — CIPROFLOXACIN HCL 500 MG PO TABS: 500.0000 mg | ORAL_TABLET | Freq: Two times a day (BID) | ORAL | Status: AC

## 2010-07-06 NOTE — Progress Notes (Signed)
  Subjective:    Patient ID: Desiree Spencer, female    DOB: May 16, 1940, 70 y.o.   MRN: [id number]  HPI Patient seen with possible UTI. Onset earlier today of some burning with urination and slight frequency. She denies any nausea or vomiting. No back pain. History of similar symptoms in past with UTI. She has responded well to Cipro in the past. Denies any vaginal discharge.   Review of Systems  Constitutional: Negative for fever, chills and appetite change.  Gastrointestinal: Negative for nausea, vomiting, abdominal pain, diarrhea and constipation.  Genitourinary: Positive for dysuria and frequency. Negative for hematuria and pelvic pain.  Musculoskeletal: Negative for back pain.  Neurological: Negative for dizziness.       Objective:   Physical Exam  Constitutional: She appears well-developed and well-nourished.  HENT:  Head: Normocephalic and atraumatic.  Neck: Neck supple. No thyromegaly present.  Cardiovascular: Normal rate, regular rhythm and normal heart sounds.   Pulmonary/Chest: Breath sounds normal.  Abdominal: Soft. Bowel sounds are normal. There is no tenderness.          Assessment & Plan:  Probable UTI. Urine culture sent. Cipro 500 mg twice daily for 7 days.

## 2010-07-06 NOTE — Patient Instructions (Signed)
Urinary Tract Infection (UTI)   Infections of the urinary tract can start in several places. A bladder infection (cystitis), a kidney infection (pyelonephritis), and a prostate infection (prostatitis) are different types of urinary tract infections. They usually get better if treated with medicines (antibiotics) that kill germs. Take all the medicine until it is gone. You or your child may feel better in a few days, but TAKE ALL MEDICINE or the infection may not respond and may become more difficult to treat.   HOME CARE INSTRUCTIONS   Drink enough water and fluids to keep the urine clear or pale yellow. Cranberry juice is especially recommended, in addition to large amounts of water.   Avoid caffeine, tea, and carbonated beverages. They tend to irritate the bladder.   Alcohol may irritate the prostate.   Only take over-the-counter or prescription medicines for pain, discomfort, or fever as directed by your caregiver.   FINDING OUT THE RESULTS OF YOUR TEST   Not all test results are available during your visit. If your or your child's test results are not back during the visit, make an appointment with your caregiver to find out the results. Do not assume everything is normal if you have not heard from your caregiver or the medical facility. It is important for you to follow up on all test results.   TO PREVENT FURTHER INFECTIONS:   Empty the bladder often. Avoid holding urine for long periods of time.   After a bowel movement, women should cleanse from front to back. Use each tissue only once.   Empty the bladder before and after sexual intercourse.   SEEK MEDICAL CARE IF:   There is back pain.   You or your child has an oral temperature above 101.   Your baby is older than 3 months with a rectal temperature of 100.5º F (38.1° C) or higher for more than 1 day.   Your or your child's problems (symptoms) are no better in 3 days. Return sooner if you or your child is getting worse.   SEEK IMMEDIATE MEDICAL CARE IF:    There is severe back pain or lower abdominal pain.   You or your child develops chills.   You or your child has an oral temperature above 101, not controlled by medicine.   Your baby is older than 3 months with a rectal temperature of 102º F (38.9º C) or higher.   Your baby is 3 months old or younger with a rectal temperature of 100.4º F (38º C) or higher.   There is nausea or vomiting.   There is continued burning or discomfort with urination.   MAKE SURE YOU:   Understand these instructions.   Will watch this condition.   Will get help right away if you or your child is not doing well or gets worse.   Document Released: 11/14/2004 Document Re-Released: 05/01/2009   Exit[employer]® Patient Information ©2011 [employer].

## 2010-07-08 LAB — URINE CULTURE

## 2010-07-10 NOTE — Progress Notes (Signed)
Quick Note:  Pt informed ______ 

## 2010-07-18 ENCOUNTER — Telehealth: Payer: Self-pay | Admitting: Family Medicine

## 2010-07-18 NOTE — Telephone Encounter (Signed)
Pt called to get the name of the yoga position that Dr Tawanna Cooler had recommended for herniated disc. Pls call.

## 2010-07-19 NOTE — Telephone Encounter (Signed)
Door to go to the book store and get a yoga book !!!!!!!!!!

## 2010-07-20 NOTE — Telephone Encounter (Signed)
patient  Is aware 

## 2010-08-27 ENCOUNTER — Encounter: Payer: Self-pay | Admitting: Family Medicine

## 2010-08-27 ENCOUNTER — Ambulatory Visit (INDEPENDENT_AMBULATORY_CARE_PROVIDER_SITE_OTHER): Payer: Medicare Other | Admitting: Family Medicine

## 2010-08-27 DIAGNOSIS — I10 Essential (primary) hypertension: Secondary | ICD-10-CM

## 2010-08-27 DIAGNOSIS — L851 Acquired keratosis [keratoderma] palmaris et plantaris: Secondary | ICD-10-CM

## 2010-08-27 DIAGNOSIS — E785 Hyperlipidemia, unspecified: Secondary | ICD-10-CM

## 2010-08-27 DIAGNOSIS — L570 Actinic keratosis: Secondary | ICD-10-CM

## 2010-08-27 LAB — LIPID PANEL: HDL: 56.1 mg/dL (ref >39.00)

## 2010-08-27 LAB — HEPATIC FUNCTION PANEL
ALT: 22 U/L (ref 0–35)
AST: 25 U/L (ref 0–37)
Alkaline Phosphatase: 67 U/L (ref 39–117)
Bilirubin, Direct: 0.1 mg/dL (ref 0.0–0.3)
Total Protein: 7.6 g/dL (ref 6.0–8.3)

## 2010-08-27 LAB — CBC WITH DIFFERENTIAL/PLATELET
Basophils Relative: 0.6 % (ref 0.0–3.0)
Eosinophils Absolute: 0.2 10*3/uL (ref 0.0–0.7)
HCT: 40.3 % (ref 36.0–46.0)
Hemoglobin: 13.8 g/dL (ref 12.0–15.0)
MCHC: 34.3 g/dL (ref 30.0–36.0)
MCV: 85.3 fl (ref 78.0–100.0)
Monocytes Absolute: 0.6 10*3/uL (ref 0.1–1.0)
Neutro Abs: 3.6 10*3/uL (ref 1.4–7.7)
RBC: 4.73 Mil/uL (ref 3.87–5.11)

## 2010-08-27 LAB — POCT URINALYSIS DIPSTICK
Glucose, UA: NEGATIVE
Ketones, UA: NEGATIVE
Leukocytes, UA: NEGATIVE
Protein, UA: NEGATIVE

## 2010-08-27 LAB — BASIC METABOLIC PANEL
CO2: 31 mEq/L (ref 19–32)
Chloride: 100 mEq/L (ref 96–112)
Creatinine, Ser: 0.9 mg/dL (ref 0.4–1.2)
Sodium: 140 mEq/L (ref 135–145)

## 2010-08-27 LAB — TSH: TSH: 1.53 u[IU]/mL (ref 0.35–5.50)

## 2010-08-27 NOTE — Patient Instructions (Signed)
Set up a 30 minute appointment sometime in the next two to 4 weeks for general physical examination we will do your blood work today

## 2010-08-27 NOTE — Progress Notes (Signed)
  Subjective:    Patient ID: Desiree Spencer, female    DOB: 1940/05/29, 70 y.o.   MRN: [id number]  HPIBetty is a 70 year old female, who comes in today for evaluation of a new mole on the left side of her leg.  She states she noticed it about a month ago.  No bleeding.  It does have slightly irregular margins    Review of Systems    General and dermatologic review of systems otherwise negative Objective:   Physical Exam    Well-developed well-nourished, female, in no acute distress.  Examination of lesions as and 6 mm x 6 mm lesion consistent with a seborrheic keratoses    Assessment & Plan:  Seborrheic keratoses, observe

## 2010-08-29 NOTE — Progress Notes (Signed)
Left message on machine for patient

## 2010-10-03 ENCOUNTER — Ambulatory Visit (INDEPENDENT_AMBULATORY_CARE_PROVIDER_SITE_OTHER): Payer: Medicare Other | Admitting: Family Medicine

## 2010-10-03 ENCOUNTER — Encounter: Payer: Self-pay | Admitting: Family Medicine

## 2010-10-03 VITALS — BP 112/80 | Temp 98.1°F | Ht 65.25 in | Wt 163.0 lb

## 2010-10-03 DIAGNOSIS — I10 Essential (primary) hypertension: Secondary | ICD-10-CM

## 2010-10-03 DIAGNOSIS — Z0000 Encounter for general adult medical examination without abnormal findings: Secondary | ICD-10-CM

## 2010-10-03 DIAGNOSIS — J309 Allergic rhinitis, unspecified: Secondary | ICD-10-CM

## 2010-10-03 DIAGNOSIS — K219 Gastro-esophageal reflux disease without esophagitis: Secondary | ICD-10-CM

## 2010-10-03 DIAGNOSIS — E785 Hyperlipidemia, unspecified: Secondary | ICD-10-CM

## 2010-10-03 MED ORDER — AMITRIPTYLINE HCL 25 MG PO TABS: 25.0000 mg | ORAL_TABLET | Freq: Every day | ORAL | Status: DC

## 2010-10-03 MED ORDER — FUROSEMIDE 20 MG PO TABS: 20.0000 mg | ORAL_TABLET | Freq: Every day | ORAL | Status: DC

## 2010-10-03 NOTE — Progress Notes (Signed)
  Subjective:    Patient ID: Desiree Spencer, female    DOB: 10-04-1940, 70 y.o.   MRN: [id number]  HPIBetty  is a 70 year old Female nonsmoker, who comes in today for Medicare wellness examination because of an underlying history of hypertension, allergic rhinitis, reflux, esophagitis, and migraine headaches, and a new problem with sleep dysfunction and  She takes Zyrtec 10 mg q. H.s. For allergic rhinitis.  She takes Mevacor, thiazide 25 mg daily, and a baby aspirin for hypertension, BP 112/80.  She is having more trouble with fluid retention and would like a stronger diuretic.  She takes over-the-counter Zantac 150 daily for reflux esophagitis.  She takes Imitrex 25 mg p.r.n. For migraine headaches.  However, the migraines have decreased in frequency and severity.  She was on Estratest for 20 years.  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  One.  She stopped the Estratest.  She began having sleep dysfunction.  She would like something to help him sleep.  She has trouble going to sleep and then she may go to sleep and wake up and can't go back to sleep.  She gets routine eye care, dental care, hearing normal, BSE monthly, and you mammography, colonoscopy, normal, tetanus, 2008, Pneumovax 2008, shingles 2009, walks daily.  Home health safety reviewed.  No issues identified.  No guns in the house.  Cognitive function, normal, she does have a healthcare power of attorney and living will    Review of Systems  Constitutional: Negative.   HENT: Negative.   Eyes: Negative.   Respiratory: Negative.   Cardiovascular: Negative.   Gastrointestinal: Negative.   Genitourinary: Negative.   Musculoskeletal: Negative.   Neurological: Negative.   Hematological: Negative.   Psychiatric/Behavioral: Negative.        Objective:   Physical Exam  Constitutional: She appears well-developed and well-nourished.  HENT:  Head: Normocephalic and atraumatic.  Right Ear:  External ear normal.  Left Ear: External ear normal.  Nose: Nose normal.  Mouth/Throat: Oropharynx is clear and moist.  Eyes: EOM are normal. Pupils are equal, round, and reactive to light.  Neck: Normal range of motion. Neck supple. No thyromegaly present.  Cardiovascular: Normal rate, regular rhythm, normal heart sounds and intact distal pulses.  Exam reveals no gallop and no friction rub.   No murmur heard. Pulmonary/Chest: Effort normal and breath sounds normal.  Abdominal: Soft. Bowel sounds are normal. She exhibits no distension and no mass. There is no tenderness. There is no rebound.  Genitourinary: Vagina normal and uterus normal. Guaiac negative stool. No vaginal discharge found.       Bilateral breast exam normal  Musculoskeletal: Normal range of motion.  Lymphadenopathy:    She has no cervical adenopathy.  Neurological: She is alert. She has normal reflexes. No cranial nerve deficit. She exhibits normal muscle tone. Coordination normal.  Skin: Skin is warm and dry.  Psychiatric: She has a normal mood and affect. Her behavior is normal. Judgment and thought content normal.          Assessment & Plan:  Healthy female.  Allergic rhinitis.  Continue Zyrtec, plane, 10 mg nightly  Hypertension, and fluid retention, DC the hydrochlorothiazide, Lasix, 20 mg daily.  Migraine headaches, quiet.  Reflux esophagitis.  Continue Zantac 150 daily.  Sleep dysfunction.  Begin Elavil 12.5 mg nightly follow-up in 4 weeks

## 2010-10-03 NOTE — Patient Instructions (Signed)
Stop the hydrochlorothiazide and begin the Lasix 20 mg daily.  Begin Elavil 25 mg one half tab nightly follow-up in 4 weeks

## 2010-10-30 ENCOUNTER — Ambulatory Visit (INDEPENDENT_AMBULATORY_CARE_PROVIDER_SITE_OTHER): Payer: Medicare Other | Admitting: Family Medicine

## 2010-10-30 ENCOUNTER — Encounter: Payer: Self-pay | Admitting: Family Medicine

## 2010-10-30 DIAGNOSIS — M25579 Pain in unspecified ankle and joints of unspecified foot: Secondary | ICD-10-CM

## 2010-10-30 DIAGNOSIS — M25571 Pain in right ankle and joints of right foot: Secondary | ICD-10-CM

## 2010-10-30 NOTE — Progress Notes (Signed)
  Subjective:    Patient ID: Desiree Spencer, female    DOB: 01-25-1941, 70 y.o.   MRN: [id number]  HPIBetty is a 70 year old female, who comes in today for evaluation of pain in her right ankle.  She states about two weeks ago, that she twisted the ankle, and it is still painful.  The dash, and she did it she noticed some swelling and bleeding.    Review of Systems General an orthopedic review of systems otherwise negative   Objective:   Physical Exam Well-developed well-nourished, female in acute distress.  Examination ankle shows some residual blood stable.  Ankle joint.       Assessment & Plan:  Torn ligament.  Plan elevation, ice, Motrin, good support shoes.  Return p.r.n.

## 2010-11-05 ENCOUNTER — Ambulatory Visit: Payer: Medicare Other | Admitting: Family Medicine

## 2010-11-06 ENCOUNTER — Telehealth: Payer: Self-pay | Admitting: Family Medicine

## 2010-11-06 NOTE — Telephone Encounter (Signed)
Hydrochlorothiazide 25 mg, dispense 100 tabs directions one q.a.m., refills x 2

## 2010-11-06 NOTE — Telephone Encounter (Signed)
Pt would like to switch back to hctz and stop lasix due to side effects. [facility] [phone]

## 2010-11-07 MED ORDER — HYDROCHLOROTHIAZIDE 25 MG PO TABS: 25.0000 mg | ORAL_TABLET | Freq: Every day | ORAL | Status: DC

## 2011-01-22 ENCOUNTER — Encounter: Payer: Medicare Other | Admitting: Family Medicine

## 2011-04-11 DIAGNOSIS — Z1231 Encounter for screening mammogram for malignant neoplasm of breast: Secondary | ICD-10-CM | POA: Diagnosis not present

## 2011-10-04 ENCOUNTER — Other Ambulatory Visit: Payer: Self-pay | Admitting: Family Medicine

## 2011-10-07 ENCOUNTER — Encounter: Payer: Medicare Other | Admitting: Family Medicine

## 2011-10-16 ENCOUNTER — Ambulatory Visit (INDEPENDENT_AMBULATORY_CARE_PROVIDER_SITE_OTHER): Payer: Medicare Other | Admitting: Family Medicine

## 2011-10-16 ENCOUNTER — Encounter: Payer: Self-pay | Admitting: Family Medicine

## 2011-10-16 VITALS — BP 130/90 | Temp 98.2°F | Ht 65.0 in | Wt 160.0 lb

## 2011-10-16 DIAGNOSIS — Z0000 Encounter for general adult medical examination without abnormal findings: Secondary | ICD-10-CM | POA: Diagnosis not present

## 2011-10-16 DIAGNOSIS — I10 Essential (primary) hypertension: Secondary | ICD-10-CM

## 2011-10-16 LAB — POCT URINALYSIS DIPSTICK
Bilirubin, UA: NEGATIVE
Glucose, UA: NEGATIVE
Ketones, UA: NEGATIVE
Spec Grav, UA: 1.015

## 2011-10-16 LAB — BASIC METABOLIC PANEL
CO2: 30 mEq/L (ref 19–32)
Chloride: 98 mEq/L (ref 96–112)
Sodium: 137 mEq/L (ref 135–145)

## 2011-10-16 LAB — CBC WITH DIFFERENTIAL/PLATELET
Basophils Relative: 0.7 % (ref 0.0–3.0)
Eosinophils Relative: 6.1 % — ABNORMAL HIGH (ref 0.0–5.0)
Lymphocytes Relative: 39.5 % (ref 12.0–46.0)
MCV: 85.5 fl (ref 78.0–100.0)
Monocytes Absolute: 0.6 10*3/uL (ref 0.1–1.0)
Monocytes Relative: 7.6 % (ref 3.0–12.0)
Neutrophils Relative %: 46.1 % (ref 43.0–77.0)
RBC: 5.05 Mil/uL (ref 3.87–5.11)
WBC: 8.3 10*3/uL (ref 4.5–10.5)

## 2011-10-16 LAB — TSH: TSH: 1.58 u[IU]/mL (ref 0.35–5.50)

## 2011-10-16 MED ORDER — HYDROCHLOROTHIAZIDE 25 MG PO TABS: 25.0000 mg | ORAL_TABLET | Freq: Every day | ORAL | Status: DC

## 2011-10-16 MED ORDER — AMITRIPTYLINE HCL 25 MG PO TABS: 25.0000 mg | ORAL_TABLET | Freq: Every day | ORAL | Status: DC

## 2011-10-16 NOTE — Patient Instructions (Addendum)
Continue current medication except stop the Zantac  Take OTC Prilosec one twice daily if after 3-4 weeks if symptoms do not improve call for a GI consult  Return in one year sooner if any problems

## 2011-10-16 NOTE — Progress Notes (Signed)
  Subjective:    Patient ID: Desiree Spencer, female    DOB: [birth year], 71 y.o.   MRN: [id number]  HPI Lorianna is a 40 -year-old female nonsmoker who comes in today for a Medicare wellness examination  She takes Zantac daily however doesn't seem to be working she's having a lot of symptoms of reflux esophagitis. She has no difficulty swallowing.  She takes Elavil 25 mg each bedtime for sleep dysfunction  She also takes Mevacor thiazide 25 mg daily for hypertension BP 130/90  She gets routine eye care, dental care, BSE monthly, and you mammography, colonoscopy 5 years ago normal, tetanus 2008, shingles 2009, Pneumovax x2.  Cognitive function normal she walks on a daily basis home health safety reviewed no issues identified, no guns in the house, she does have a health care power of attorney and living will   Review of Systems  Constitutional: Negative.   HENT: Negative.   Eyes: Negative.   Respiratory: Negative.   Cardiovascular: Negative.   Gastrointestinal: Negative.   Genitourinary: Negative.   Musculoskeletal: Negative.   Neurological: Negative.   Hematological: Negative.   Psychiatric/Behavioral: Negative.        Objective:   Physical Exam  Constitutional: She appears well-developed and well-nourished.  HENT:  Head: Normocephalic and atraumatic.  Right Ear: External ear normal.  Left Ear: External ear normal.  Nose: Nose normal.  Mouth/Throat: Oropharynx is clear and moist.  Eyes: EOM are normal. Pupils are equal, round, and reactive to light.  Neck: Normal range of motion. Neck supple. No thyromegaly present.  Cardiovascular: Normal rate, regular rhythm, normal heart sounds and intact distal pulses.  Exam reveals no gallop and no friction rub.   No murmur heard. Pulmonary/Chest: Effort normal and breath sounds normal.  Abdominal: Soft. Bowel sounds are normal. She exhibits no distension and no mass. There is no tenderness. There is no rebound.  Genitourinary:   Bilateral breast exam normal  Musculoskeletal: Normal range of motion.  Lymphadenopathy:    She has no cervical adenopathy.  Neurological: She is alert. She has normal reflexes. No cranial nerve deficit. She exhibits normal muscle tone. Coordination normal.  Skin: Skin is warm and dry.  Psychiatric: She has a normal mood and affect. Her behavior is normal. Judgment and thought content normal.          Assessment & Plan:  Healthy female  Hypertension at goal continue hydrochlorothiazide 25 mg daily  Sleep dysfunction continue Elavil 25 mg each bedtime  Reflux esophagitis change to OTC Prilosec if symptoms do not improve GI consult  Migraine headaches quiet for now

## 2011-11-08 DIAGNOSIS — Z23 Encounter for immunization: Secondary | ICD-10-CM | POA: Diagnosis not present

## 2012-02-05 ENCOUNTER — Telehealth: Payer: Self-pay | Admitting: Family Medicine

## 2012-02-05 NOTE — Telephone Encounter (Signed)
Patient called stating that she thinks has a bladder inf as she is having burning,urgency and pain when urinating and she would like an appt. Please assist.

## 2012-02-05 NOTE — Telephone Encounter (Signed)
Ok to work in per [employer]. Appt made - pt aware.

## 2012-02-06 ENCOUNTER — Ambulatory Visit (INDEPENDENT_AMBULATORY_CARE_PROVIDER_SITE_OTHER): Payer: Medicare Other | Admitting: Family Medicine

## 2012-02-06 ENCOUNTER — Encounter: Payer: Self-pay | Admitting: Family Medicine

## 2012-02-06 VITALS — BP 140/90 | Temp 97.4°F | Wt 164.0 lb

## 2012-02-06 DIAGNOSIS — R300 Dysuria: Secondary | ICD-10-CM

## 2012-02-06 DIAGNOSIS — N3000 Acute cystitis without hematuria: Secondary | ICD-10-CM | POA: Diagnosis not present

## 2012-02-06 LAB — POCT URINALYSIS DIPSTICK
Glucose, UA: NEGATIVE
Ketones, UA: NEGATIVE
Spec Grav, UA: <=1.005
Urobilinogen, UA: 0.2

## 2012-02-06 MED ORDER — CIPROFLOXACIN HCL 250 MG PO TABS: ORAL_TABLET | ORAL | Status: DC

## 2012-02-06 MED ORDER — SULFAMETHOXAZOLE-TRIMETHOPRIM 800-160 MG PO TABS: 1.0000 | ORAL_TABLET | Freq: Two times a day (BID) | ORAL | Status: DC

## 2012-02-06 NOTE — Patient Instructions (Signed)
Drink lots of water  Septra DS one twice daily till bilateral empty if this does not resolve your symptoms stop the Septra and switch to the Cipro

## 2012-02-06 NOTE — Progress Notes (Signed)
  Subjective:    Patient ID: Desiree Spencer, female    DOB: January 09, 1941, 71 y.o.   MRN: [id number]  HPI Desiree Spencer is a 71 year old female who comes in today for treatment of urinary tract infection  Yesterday she began having symptoms of frequency and dysuria. No fever chills or back pain. Last eye tract infection was a cystitis type infection 3 years ago.    Review of Systems General and your track review of systems otherwise negative    Objective:   Physical Exam Well-developed well nourished female no acute distress abdominal exam normal  Urinalysis shows 3+ white cells 1+ red cells       Assessment & Plan:  Acute UTI plan Septra twice a day for 10 days return when necessary

## 2012-02-09 LAB — URINE CULTURE: Colony Count: >=100000

## 2012-03-04 DIAGNOSIS — M999 Biomechanical lesion, unspecified: Secondary | ICD-10-CM | POA: Diagnosis not present

## 2012-03-04 DIAGNOSIS — M5137 Other intervertebral disc degeneration, lumbosacral region: Secondary | ICD-10-CM | POA: Diagnosis not present

## 2012-03-05 DIAGNOSIS — M5137 Other intervertebral disc degeneration, lumbosacral region: Secondary | ICD-10-CM | POA: Diagnosis not present

## 2012-03-05 DIAGNOSIS — M999 Biomechanical lesion, unspecified: Secondary | ICD-10-CM | POA: Diagnosis not present

## 2012-03-09 DIAGNOSIS — M5137 Other intervertebral disc degeneration, lumbosacral region: Secondary | ICD-10-CM | POA: Diagnosis not present

## 2012-03-09 DIAGNOSIS — M999 Biomechanical lesion, unspecified: Secondary | ICD-10-CM | POA: Diagnosis not present

## 2012-03-10 DIAGNOSIS — M5137 Other intervertebral disc degeneration, lumbosacral region: Secondary | ICD-10-CM | POA: Diagnosis not present

## 2012-03-10 DIAGNOSIS — M999 Biomechanical lesion, unspecified: Secondary | ICD-10-CM | POA: Diagnosis not present

## 2012-03-11 DIAGNOSIS — M999 Biomechanical lesion, unspecified: Secondary | ICD-10-CM | POA: Diagnosis not present

## 2012-03-11 DIAGNOSIS — M5137 Other intervertebral disc degeneration, lumbosacral region: Secondary | ICD-10-CM | POA: Diagnosis not present

## 2012-03-16 DIAGNOSIS — M999 Biomechanical lesion, unspecified: Secondary | ICD-10-CM | POA: Diagnosis not present

## 2012-03-16 DIAGNOSIS — M5137 Other intervertebral disc degeneration, lumbosacral region: Secondary | ICD-10-CM | POA: Diagnosis not present

## 2012-03-17 DIAGNOSIS — M999 Biomechanical lesion, unspecified: Secondary | ICD-10-CM | POA: Diagnosis not present

## 2012-03-17 DIAGNOSIS — M5137 Other intervertebral disc degeneration, lumbosacral region: Secondary | ICD-10-CM | POA: Diagnosis not present

## 2012-03-23 DIAGNOSIS — M999 Biomechanical lesion, unspecified: Secondary | ICD-10-CM | POA: Diagnosis not present

## 2012-03-23 DIAGNOSIS — M5137 Other intervertebral disc degeneration, lumbosacral region: Secondary | ICD-10-CM | POA: Diagnosis not present

## 2012-03-25 DIAGNOSIS — M999 Biomechanical lesion, unspecified: Secondary | ICD-10-CM | POA: Diagnosis not present

## 2012-03-25 DIAGNOSIS — M5137 Other intervertebral disc degeneration, lumbosacral region: Secondary | ICD-10-CM | POA: Diagnosis not present

## 2012-03-30 DIAGNOSIS — M999 Biomechanical lesion, unspecified: Secondary | ICD-10-CM | POA: Diagnosis not present

## 2012-03-30 DIAGNOSIS — M5137 Other intervertebral disc degeneration, lumbosacral region: Secondary | ICD-10-CM | POA: Diagnosis not present

## 2012-04-01 DIAGNOSIS — M5137 Other intervertebral disc degeneration, lumbosacral region: Secondary | ICD-10-CM | POA: Diagnosis not present

## 2012-04-01 DIAGNOSIS — M999 Biomechanical lesion, unspecified: Secondary | ICD-10-CM | POA: Diagnosis not present

## 2012-04-06 DIAGNOSIS — M5137 Other intervertebral disc degeneration, lumbosacral region: Secondary | ICD-10-CM | POA: Diagnosis not present

## 2012-04-06 DIAGNOSIS — M999 Biomechanical lesion, unspecified: Secondary | ICD-10-CM | POA: Diagnosis not present

## 2012-04-07 DIAGNOSIS — M5137 Other intervertebral disc degeneration, lumbosacral region: Secondary | ICD-10-CM | POA: Diagnosis not present

## 2012-04-07 DIAGNOSIS — M999 Biomechanical lesion, unspecified: Secondary | ICD-10-CM | POA: Diagnosis not present

## 2012-04-27 DIAGNOSIS — M5137 Other intervertebral disc degeneration, lumbosacral region: Secondary | ICD-10-CM | POA: Diagnosis not present

## 2012-05-01 ENCOUNTER — Ambulatory Visit (INDEPENDENT_AMBULATORY_CARE_PROVIDER_SITE_OTHER): Payer: Medicare Other | Admitting: Family Medicine

## 2012-05-01 ENCOUNTER — Encounter: Payer: Self-pay | Admitting: Family Medicine

## 2012-05-01 VITALS — BP 120/84 | HR 108 | Temp 98.6°F | Wt 158.0 lb

## 2012-05-01 DIAGNOSIS — R8290 Unspecified abnormal findings in urine: Secondary | ICD-10-CM

## 2012-05-01 LAB — POCT URINALYSIS DIPSTICK
Protein, UA: NEGATIVE
Spec Grav, UA: 1.02
Urobilinogen, UA: 0.2
pH, UA: 6.5

## 2012-05-01 NOTE — Progress Notes (Signed)
Chief Complaint  Patient presents with  . urine odor    HPI:  Acute visit for dysuria: -last 1-2 weeks - strong smell to urine -can't think of any new meds or foods -thinks she drinks enough fluids -worried about UTI because has had a number of UTIs in the past -denies: fevers, chills, abd pain, dysuria, frequency or urgency, bowel changes, vaginal symptoms  ROS: See pertinent positives and negatives per HPI.  Past Medical History  Diagnosis Date  . HYPERLIPIDEMIA 07/01/2008  . HYPERTENSION 08/14/2006  . ALLERGIC RHINITIS 08/14/2006  . Extrinsic asthma, unspecified 03/30/2007  . GERD 08/14/2006  . DIVERTICULITIS, ACUTE 06/07/2008  . Acute cystitis 07/22/2007  . PILAR CYST 07/20/2008  . OSTEOARTHRITIS 08/14/2006  . DISC DISEASE, LUMBAR 08/07/2009  . ROTATOR CUFF INJURY, RIGHT SHOULDER 11/11/2008  . HEPATITIS B, HX OF 08/14/2006    Family History  Problem Relation Age of Onset  . Hyperlipidemia Other   . Hypertension Other     History   Social History  . Marital Status: Married    Spouse Name: N/A    Number of Children: N/A  . Years of Education: N/A   Social History Main Topics  . Smoking status: Never Smoker   . Smokeless tobacco: None  . Alcohol Use: None  . Drug Use: None  . Sexually Active: None   Other Topics Concern  . None   Social History Narrative  . None    Current outpatient prescriptions:AMBULATORY NON FORMULARY MEDICATION, Medication Name: calcium 750 mg daily , Disp: , Rfl: ;  amitriptyline (ELAVIL) 25 MG tablet, Take 1 tablet (25 mg total) by mouth at bedtime., Disp: 100 tablet, Rfl: 3;  aspirin 81 MG tablet, Take 81 mg by mouth daily.  , Disp: , Rfl: ;  cetirizine (ZYRTEC) 10 MG tablet, Take 10 mg by mouth daily.  , Disp: , Rfl:  ciprofloxacin (CIPRO) 250 MG tablet, 1 by mouth twice a day for one week, Disp: 14 tablet, Rfl: 0;  Cranberry-Vitamin C-Vitamin E (CRANBERRY SUPER STRENGTH) 6000-100-3 MG-MG-UNIT CAPS, Take 1 capsule by mouth daily.  , Disp: ,  Rfl: ;  fish oil-omega-3 fatty acids 1000 MG capsule, Take 1 g by mouth daily.  , Disp: , Rfl: ;  hydrochlorothiazide (HYDRODIURIL) 25 MG tablet, Take 1 tablet (25 mg total) by mouth daily., Disp: 100 tablet, Rfl: 3 magnesium oxide (MAG-OX) 400 MG tablet, Take 400 mg by mouth daily.  , Disp: , Rfl: ;  Melatonin 1 MG CAPS, Take 3 capsules by mouth daily.  , Disp: , Rfl: ;  Omega 3-6-9 Fatty Acids (TRIPLE OMEGA COMPLEX PO), Take 1 capsule by mouth daily.  , Disp: , Rfl: ;  Potassium 99 MG TABS, Take 1 tablet by mouth daily.  , Disp: , Rfl: ;  ranitidine (ZANTAC) 150 MG capsule, Take 150 mg by mouth daily.  , Disp: , Rfl:  sulfamethoxazole-trimethoprim (BACTRIM DS,SEPTRA DS) 800-160 MG per tablet, Take 1 tablet by mouth 2 (two) times daily., Disp: 20 tablet, Rfl: 0;  SUMAtriptan (IMITREX) 25 MG tablet, Take 25 mg by mouth every 2 (two) hours as needed.  , Disp: , Rfl: ;  Zinc 50 MG CAPS, Take 1 capsule by mouth daily.  , Disp: , Rfl:   EXAM:  Filed Vitals:   05/01/12 0842  BP: 120/84  Pulse: 108  Temp: 98.6 F (37 C)    Body mass index is 26.29 kg/(m^2).  GENERAL: vitals reviewed and listed above, alert, oriented, appears well hydrated  and in no acute distress  HEENT: atraumatic, conjunttiva clear, no obvious abnormalities on inspection of external nose and ears  NECK: no obvious masses on inspection  LUNGS: clear to auscultation bilaterally, no wheezes, rales or rhonchi, good air movement  CV: HRRR, no peripheral edema  ABD: BS +, soft, NTTP, no CVA TTP  MS: moves all extremities without noticeable abnormality  PSYCH: pleasant and cooperative, no obvious depression or anxiety  ASSESSMENT AND PLAN:  Discussed the following assessment and plan:  Bad odor of urine - Plan: POCT urinalysis dipstick, Culture, Urine  -urine dip unconvincing and pt without any alarming symptoms so will get culture to ensure no infection - in the interim advised plenty of water, cranberry and blueberry  juice and return precuations -if no infection advised follow up if persists or worsening of symptoms -Patient advised to return or notify a doctor immediately if symptoms worsen or persist or new concerns arise.  There are no Patient Instructions on file for this visit.   Kriste Basque R.

## 2012-05-03 ENCOUNTER — Telehealth: Payer: Self-pay | Admitting: Family Medicine

## 2012-05-03 NOTE — Telephone Encounter (Signed)
c 

## 2012-05-04 MED ORDER — AMOXICILLIN 500 MG PO CAPS: 500.0000 mg | ORAL_CAPSULE | Freq: Three times a day (TID) | ORAL | Status: DC

## 2012-05-04 NOTE — Telephone Encounter (Signed)
Urine culture with Strep viridans - likely a contaminate as not a usual org in urine. Please schedule nurse visit today to come in get good clean catch to send for repeat culture and give 1 gram rocephin. And will have her do amoxicillin just in case too - sent to pharmacy.

## 2012-05-05 ENCOUNTER — Ambulatory Visit (INDEPENDENT_AMBULATORY_CARE_PROVIDER_SITE_OTHER): Payer: Medicare Other

## 2012-05-05 DIAGNOSIS — N390 Urinary tract infection, site not specified: Secondary | ICD-10-CM | POA: Diagnosis not present

## 2012-05-05 MED ORDER — CEFTRIAXONE SODIUM 1 G IJ SOLR
1.0000 g | Freq: Once | INTRAMUSCULAR | Status: AC
  Administered 2012-05-05: 1 g via INTRAMUSCULAR

## 2012-05-05 NOTE — Telephone Encounter (Signed)
Called and spoke with pt and pt is aware.  

## 2012-05-07 LAB — URINE CULTURE: Colony Count: >=100000

## 2012-05-12 ENCOUNTER — Encounter: Payer: Self-pay | Admitting: Family Medicine

## 2012-10-06 DIAGNOSIS — R920 Mammographic microcalcification found on diagnostic imaging of breast: Secondary | ICD-10-CM | POA: Diagnosis not present

## 2012-10-13 ENCOUNTER — Other Ambulatory Visit: Payer: Self-pay | Admitting: *Deleted

## 2012-10-13 DIAGNOSIS — I10 Essential (primary) hypertension: Secondary | ICD-10-CM

## 2012-10-13 DIAGNOSIS — Z0000 Encounter for general adult medical examination without abnormal findings: Secondary | ICD-10-CM

## 2012-10-13 MED ORDER — HYDROCHLOROTHIAZIDE 25 MG PO TABS: 25.0000 mg | ORAL_TABLET | Freq: Every day | ORAL | Status: DC

## 2012-10-13 MED ORDER — AMITRIPTYLINE HCL 25 MG PO TABS: 25.0000 mg | ORAL_TABLET | Freq: Every day | ORAL | Status: DC

## 2012-10-20 ENCOUNTER — Encounter: Payer: Medicare Other | Admitting: Family Medicine

## 2012-10-24 ENCOUNTER — Ambulatory Visit (INDEPENDENT_AMBULATORY_CARE_PROVIDER_SITE_OTHER): Payer: Medicare Other | Admitting: Internal Medicine

## 2012-10-24 ENCOUNTER — Encounter: Payer: Self-pay | Admitting: Internal Medicine

## 2012-10-24 VITALS — BP 120/80 | Temp 97.4°F | Wt 158.0 lb

## 2012-10-24 DIAGNOSIS — R300 Dysuria: Secondary | ICD-10-CM | POA: Diagnosis not present

## 2012-10-24 DIAGNOSIS — N3000 Acute cystitis without hematuria: Secondary | ICD-10-CM | POA: Diagnosis not present

## 2012-10-24 LAB — POCT URINALYSIS DIPSTICK
Bilirubin, UA: NEGATIVE
Glucose, UA: NEGATIVE
Ketones, UA: NEGATIVE
Spec Grav, UA: <=1.005
Urobilinogen, UA: 0.2

## 2012-10-24 MED ORDER — CIPROFLOXACIN HCL 500 MG PO TABS: 500.0000 mg | ORAL_TABLET | Freq: Two times a day (BID) | ORAL | Status: DC

## 2012-10-24 MED ORDER — CIPROFLOXACIN HCL 250 MG PO TABS: ORAL_TABLET | ORAL | Status: DC

## 2012-10-24 NOTE — Progress Notes (Signed)
Chief Complaint  Patient presents with  . Dysuria    HPI: Patient comes in today for [facility] Saturday clinic for  new problem evaluation. Onset  One day burning and frequency  And no blood  Septra  Causes nausea  Seems like typicla ti she may get about 2 x per year.  cipro has worked without side effects.  Would prefer this medication. No diarrhea   ROS: See pertinent positives and negatives per HPI.no fever chills flank pain   Past Medical History  Diagnosis Date  . HYPERLIPIDEMIA 07/01/2008  . HYPERTENSION 08/14/2006  . ALLERGIC RHINITIS 08/14/2006  . Extrinsic asthma, unspecified 03/30/2007  . GERD 08/14/2006  . DIVERTICULITIS, ACUTE 06/07/2008  . Acute cystitis 07/22/2007  . PILAR CYST 07/20/2008  . OSTEOARTHRITIS 08/14/2006  . DISC DISEASE, LUMBAR 08/07/2009  . ROTATOR CUFF INJURY, RIGHT SHOULDER 11/11/2008  . HEPATITIS B, HX OF 08/14/2006    Family History  Problem Relation Age of Onset  . Hyperlipidemia Other   . Hypertension Other     History   Social History  . Marital Status: Married    Spouse Name: N/A    Number of Children: N/A  . Years of Education: N/A   Social History Main Topics  . Smoking status: Never Smoker   . Smokeless tobacco: None  . Alcohol Use: None  . Drug Use: None  . Sexual Activity: None   Other Topics Concern  . None   Social History Narrative  . None    Outpatient Encounter Prescriptions as of 10/24/2012  Medication Sig Dispense Refill  . AMBULATORY NON FORMULARY MEDICATION Medication Name: calcium 750 mg daily       . amitriptyline (ELAVIL) 25 MG tablet Take 1 tablet (25 mg total) by mouth at bedtime.  100 tablet  3  . amoxicillin (AMOXIL) 500 MG capsule Take 1 capsule (500 mg total) by mouth 3 (three) times daily.  15 capsule  0  . aspirin 81 MG tablet Take 81 mg by mouth daily.        . cetirizine (ZYRTEC) 10 MG tablet Take 10 mg by mouth daily.        . Cranberry-Vitamin C-Vitamin E (CRANBERRY SUPER STRENGTH) 6000-100-3 MG-MG-UNIT CAPS  Take 1 capsule by mouth daily.        . fish oil-omega-3 fatty acids 1000 MG capsule Take 1 g by mouth daily.        . hydrochlorothiazide (HYDRODIURIL) 25 MG tablet Take 1 tablet (25 mg total) by mouth daily.  100 tablet  3  . magnesium oxide (MAG-OX) 400 MG tablet Take 400 mg by mouth daily.        . Melatonin 1 MG CAPS Take 3 capsules by mouth daily.        Ailene Ards 3-6-9 Fatty Acids (TRIPLE OMEGA COMPLEX PO) Take 1 capsule by mouth daily.        . Potassium 99 MG TABS Take 1 tablet by mouth daily.        . ranitidine (ZANTAC) 150 MG capsule Take 150 mg by mouth daily.        Marland Kitchen sulfamethoxazole-trimethoprim (BACTRIM DS,SEPTRA DS) 800-160 MG per tablet Take 1 tablet by mouth 2 (two) times daily.  20 tablet  0  . SUMAtriptan (IMITREX) 25 MG tablet Take 25 mg by mouth every 2 (two) hours as needed.        . Zinc 50 MG CAPS Take 1 capsule by mouth daily.        . [  DISCONTINUED] ciprofloxacin (CIPRO) 250 MG tablet 1 by mouth twice a day for one week  14 tablet  0  . [DISCONTINUED] ciprofloxacin (CIPRO) 250 MG tablet 1 by mouth twice a day for one week  6 tablet  0  . ciprofloxacin (CIPRO) 500 MG tablet Take 1 tablet (500 mg total) by mouth 2 (two) times daily.  6 tablet  0   No facility-administered encounter medications on file as of 10/24/2012.    EXAM:  BP 120/80  Temp(Src) 97.4 F (36.3 C) (Oral)  Wt 158 lb (71.668 kg)  BMI 26.29 kg/m2  Body mass index is 26.29 kg/(m^2).  GENERAL: vitals reviewed and listed above, alert, oriented, appears well hydrated and in no acute distress  HEENT: atraumatic, conjunctiva  clear, no obvious abnormalities on inspection of external nose and ears  NECK: no obvious masses on inspection palpation   Abdomen:  Sof,t normal bowel sounds without hepatosplenomegaly, no guarding rebound or masses no CVA tenderness MS: moves all extremities without noticeable focal  abnormality  PSYCH: pleasant and cooperative, no obvious depression or anxiety  ASSESSMENT  AND PLAN:  Discussed the following assessment and plan:  Dysuria - Plan: POCT urinalysis dipstick, POCT urinalysis dipstick  Acute cystitis - typical sx empiric rx without culture at this time  - Plan: DISCONTINUED: ciprofloxacin (CIPRO) 250 MG tablet gets nausea with septra  Given 500 mg cipro instead of 250 she says works better! -Patient advised to return or notify health care team  if symptoms worsen or persist or new concerns arise.  Patient Instructions  Medication should resolve issues  i f not then would do urine culture tests.        Neta Mends. Mataio Mele M.D.

## 2012-10-24 NOTE — Patient Instructions (Signed)
Medication should resolve issues  i f not then would do urine culture tests.

## 2012-10-26 DIAGNOSIS — Z23 Encounter for immunization: Secondary | ICD-10-CM | POA: Diagnosis not present

## 2012-11-10 ENCOUNTER — Encounter: Payer: Self-pay | Admitting: Family Medicine

## 2012-11-10 ENCOUNTER — Ambulatory Visit (INDEPENDENT_AMBULATORY_CARE_PROVIDER_SITE_OTHER): Payer: Medicare Other | Admitting: Family Medicine

## 2012-11-10 VITALS — BP 130/80 | Temp 97.5°F | Wt 160.0 lb

## 2012-11-10 DIAGNOSIS — R300 Dysuria: Secondary | ICD-10-CM | POA: Diagnosis not present

## 2012-11-10 LAB — POCT URINALYSIS DIPSTICK
Glucose, UA: NEGATIVE
Nitrite, UA: NEGATIVE
Spec Grav, UA: 1.015
Urobilinogen, UA: 0.2

## 2012-11-10 MED ORDER — CIPROFLOXACIN HCL 500 MG PO TABS: 500.0000 mg | ORAL_TABLET | Freq: Two times a day (BID) | ORAL | Status: DC

## 2012-11-10 NOTE — Addendum Note (Signed)
Addended by: Azucena Freed on: 11/10/2012 09:17 AM   Modules accepted: Orders

## 2012-11-10 NOTE — Progress Notes (Signed)
No chief complaint on file.   HPI:  Acute visit for dysuria: -reports took cipro for 3 days for UTI 2 weeks ago -seemed like symptoms improved a little, but then a little dysuria returned - she thinks 3 days is just not enough for tx -she reports some frequency as well -denies: NVD, chills, flank pain  ROS: See pertinent positives and negatives per HPI.  Past Medical History  Diagnosis Date  . HYPERLIPIDEMIA 07/01/2008  . HYPERTENSION 08/14/2006  . ALLERGIC RHINITIS 08/14/2006  . Extrinsic asthma, unspecified 03/30/2007  . GERD 08/14/2006  . DIVERTICULITIS, ACUTE 06/07/2008  . Acute cystitis 07/22/2007  . PILAR CYST 07/20/2008  . OSTEOARTHRITIS 08/14/2006  . DISC DISEASE, LUMBAR 08/07/2009  . ROTATOR CUFF INJURY, RIGHT SHOULDER 11/11/2008  . HEPATITIS B, HX OF 08/14/2006    Past Surgical History  Procedure Laterality Date  . Childbirth      X 2  . Cholecystectomy    . Abdominal hysterectomy      Family History  Problem Relation Age of Onset  . Hyperlipidemia Other   . Hypertension Other     History   Social History  . Marital Status: Married    Spouse Name: N/A    Number of Children: N/A  . Years of Education: N/A   Social History Main Topics  . Smoking status: Never Smoker   . Smokeless tobacco: Not on file  . Alcohol Use: Not on file  . Drug Use: Not on file  . Sexual Activity: Not on file   Other Topics Concern  . Not on file   Social History Narrative  . No narrative on file    Current outpatient prescriptions:AMBULATORY NON FORMULARY MEDICATION, Medication Name: calcium 750 mg daily , Disp: , Rfl: ;  amitriptyline (ELAVIL) 25 MG tablet, Take 1 tablet (25 mg total) by mouth at bedtime., Disp: 100 tablet, Rfl: 3;  amoxicillin (AMOXIL) 500 MG capsule, Take 1 capsule (500 mg total) by mouth 3 (three) times daily., Disp: 15 capsule, Rfl: 0;  aspirin 81 MG tablet, Take 81 mg by mouth daily.  , Disp: , Rfl:  cetirizine (ZYRTEC) 10 MG tablet, Take 10 mg by mouth daily.   , Disp: , Rfl: ;  ciprofloxacin (CIPRO) 500 MG tablet, Take 1 tablet (500 mg total) by mouth 2 (two) times daily., Disp: 6 tablet, Rfl: 0;  ciprofloxacin (CIPRO) 500 MG tablet, Take 1 tablet (500 mg total) by mouth 2 (two) times daily., Disp: 6 tablet, Rfl: 0 Cranberry-Vitamin C-Vitamin E (CRANBERRY SUPER STRENGTH) 6000-100-3 MG-MG-UNIT CAPS, Take 1 capsule by mouth daily.  , Disp: , Rfl: ;  fish oil-omega-3 fatty acids 1000 MG capsule, Take 1 g by mouth daily.  , Disp: , Rfl: ;  hydrochlorothiazide (HYDRODIURIL) 25 MG tablet, Take 1 tablet (25 mg total) by mouth daily., Disp: 100 tablet, Rfl: 3;  magnesium oxide (MAG-OX) 400 MG tablet, Take 400 mg by mouth daily.  , Disp: , Rfl:  Melatonin 1 MG CAPS, Take 3 capsules by mouth daily.  , Disp: , Rfl: ;  Omega 3-6-9 Fatty Acids (TRIPLE OMEGA COMPLEX PO), Take 1 capsule by mouth daily.  , Disp: , Rfl: ;  Potassium 99 MG TABS, Take 1 tablet by mouth daily.  , Disp: , Rfl: ;  ranitidine (ZANTAC) 150 MG capsule, Take 150 mg by mouth daily.  , Disp: , Rfl:  sulfamethoxazole-trimethoprim (BACTRIM DS,SEPTRA DS) 800-160 MG per tablet, Take 1 tablet by mouth 2 (two) times daily., Disp: 20 tablet,  Rfl: 0;  SUMAtriptan (IMITREX) 25 MG tablet, Take 25 mg by mouth every 2 (two) hours as needed.  , Disp: , Rfl: ;  Zinc 50 MG CAPS, Take 1 capsule by mouth daily.  , Disp: , Rfl:   EXAM:  There were no vitals filed for this visit.  There is no weight on file to calculate BMI.  GENERAL: vitals reviewed and listed above, alert, oriented, appears well hydrated and in no acute distress  HEENT: atraumatic, conjunttiva clear, no obvious abnormalities on inspection of external nose and ears  NECK: no obvious masses on inspection  LUNGS: clear to auscultation bilaterally, no wheezes, rales or rhonchi, good air movement  CV: HRRR, no peripheral edema  ABD: soft, NTTP, no CVA TTP  MS: moves all extremities without noticeable abnormality  PSYCH: pleasant and cooperative,  no obvious depression or anxiety  ASSESSMENT AND PLAN:  Discussed the following assessment and plan:  Dysuria - Plan: ciprofloxacin (CIPRO) 500 MG tablet, Culture, Urine  -some pyuria, discussed options - she wants to do a few more days of cipro while waiting on culture, discussed risks and return precuations -Patient advised to return or notify a doctor immediately if symptoms worsen or persist or new concerns arise.  There are no Patient Instructions on file for this visit.   Desiree Basque R.

## 2012-11-13 LAB — URINE CULTURE: Colony Count: >=100000

## 2012-11-13 MED ORDER — NITROFURANTOIN MONOHYD MACRO 100 MG PO CAPS: 100.0000 mg | ORAL_CAPSULE | Freq: Two times a day (BID) | ORAL | Status: DC

## 2012-11-13 NOTE — Progress Notes (Signed)
Quick Note:  Called and spoke with pt and pt is aware. ______ 

## 2012-11-13 NOTE — Progress Notes (Signed)
Quick Note:  Left a message for return call. ______ 

## 2012-11-13 NOTE — Addendum Note (Signed)
Addended by: Azucena Freed on: 11/13/2012 03:01 PM   Modules accepted: Orders, Medications

## 2012-11-13 NOTE — Progress Notes (Signed)
Quick Note:  Rx sent to pharmacy. ______ 

## 2012-12-23 ENCOUNTER — Encounter: Payer: Medicare Other | Admitting: Family Medicine

## 2013-01-08 ENCOUNTER — Ambulatory Visit (INDEPENDENT_AMBULATORY_CARE_PROVIDER_SITE_OTHER): Payer: Medicare Other | Admitting: Family Medicine

## 2013-01-08 ENCOUNTER — Encounter: Payer: Self-pay | Admitting: Family Medicine

## 2013-01-08 VITALS — BP 100/60 | Temp 98.2°F | Wt 162.0 lb

## 2013-01-08 DIAGNOSIS — J069 Acute upper respiratory infection, unspecified: Secondary | ICD-10-CM

## 2013-01-08 MED ORDER — BENZONATATE 100 MG PO CAPS: 100.0000 mg | ORAL_CAPSULE | Freq: Two times a day (BID) | ORAL | Status: DC | PRN

## 2013-01-08 NOTE — Patient Instructions (Signed)

## 2013-01-08 NOTE — Progress Notes (Signed)
Chief Complaint  Patient presents with  . Sore Throat    cough, achy body     HPI:  -started: -symptoms:nasal congestion, sore throat, hoarse voice, cough, drainage in throa, subjective low grade fever, body aches -denies:fever, SOB, NVD, tooth pain -has tried: expired cough medication -sick contacts/travel/risks: denies flu exposure, tick exposure or or Ebola risks -Hx of: allergies ROS: See pertinent positives and negatives per HPI.  Past Medical History  Diagnosis Date  . HYPERLIPIDEMIA 07/01/2008  . HYPERTENSION 08/14/2006  . ALLERGIC RHINITIS 08/14/2006  . Extrinsic asthma, unspecified 03/30/2007  . GERD 08/14/2006  . DIVERTICULITIS, ACUTE 06/07/2008  . Acute cystitis 07/22/2007  . PILAR CYST 07/20/2008  . OSTEOARTHRITIS 08/14/2006  . DISC DISEASE, LUMBAR 08/07/2009  . ROTATOR CUFF INJURY, RIGHT SHOULDER 11/11/2008  . HEPATITIS B, HX OF 08/14/2006    Past Surgical History  Procedure Laterality Date  . Childbirth      X 2  . Cholecystectomy    . Abdominal hysterectomy      Family History  Problem Relation Age of Onset  . Hyperlipidemia Other   . Hypertension Other     History   Social History  . Marital Status: Married    Spouse Name: N/A    Number of Children: N/A  . Years of Education: N/A   Social History Main Topics  . Smoking status: Never Smoker   . Smokeless tobacco: None  . Alcohol Use: None  . Drug Use: None  . Sexual Activity: None   Other Topics Concern  . None   Social History Narrative  . None    Current outpatient prescriptions:AMBULATORY NON FORMULARY MEDICATION, Medication Name: calcium 750 mg daily , Disp: , Rfl: ;  amitriptyline (ELAVIL) 25 MG tablet, Take 1 tablet (25 mg total) by mouth at bedtime., Disp: 100 tablet, Rfl: 3;  aspirin 81 MG tablet, Take 81 mg by mouth daily.  , Disp: , Rfl: ;  cetirizine (ZYRTEC) 10 MG tablet, Take 10 mg by mouth daily.  , Disp: , Rfl:  Cranberry-Vitamin C-Vitamin E (CRANBERRY SUPER STRENGTH) 6000-100-3  MG-MG-UNIT CAPS, Take 1 capsule by mouth daily.  , Disp: , Rfl: ;  fish oil-omega-3 fatty acids 1000 MG capsule, Take 1 g by mouth daily.  , Disp: , Rfl: ;  hydrochlorothiazide (HYDRODIURIL) 25 MG tablet, Take 1 tablet (25 mg total) by mouth daily., Disp: 100 tablet, Rfl: 3;  magnesium oxide (MAG-OX) 400 MG tablet, Take 400 mg by mouth daily.  , Disp: , Rfl:  Melatonin 1 MG CAPS, Take 3 capsules by mouth daily.  , Disp: , Rfl: ;  nitrofurantoin, macrocrystal-monohydrate, (MACROBID) 100 MG capsule, Take 1 capsule (100 mg total) by mouth 2 (two) times daily., Disp: 14 capsule, Rfl: 0;  Omega 3-6-9 Fatty Acids (TRIPLE OMEGA COMPLEX PO), Take 1 capsule by mouth daily.  , Disp: , Rfl: ;  Potassium 99 MG TABS, Take 1 tablet by mouth daily.  , Disp: , Rfl:  ranitidine (ZANTAC) 150 MG capsule, Take 150 mg by mouth daily.  , Disp: , Rfl: ;  sulfamethoxazole-trimethoprim (BACTRIM DS,SEPTRA DS) 800-160 MG per tablet, Take 1 tablet by mouth 2 (two) times daily., Disp: 20 tablet, Rfl: 0;  SUMAtriptan (IMITREX) 25 MG tablet, Take 25 mg by mouth every 2 (two) hours as needed.  , Disp: , Rfl: ;  Zinc 50 MG CAPS, Take 1 capsule by mouth daily.  , Disp: , Rfl:  benzonatate (TESSALON) 100 MG capsule, Take 1 capsule (100 mg total)  by mouth 2 (two) times daily as needed for cough., Disp: 20 capsule, Rfl: 0  EXAM:  Filed Vitals:   01/08/13 1001  BP: 100/60  Temp: 98.2 F (36.8 C)    Body mass index is 26.96 kg/(m^2).  GENERAL: vitals reviewed and listed above, alert, oriented, appears well hydrated and in no acute distress  HEENT: atraumatic, conjunttiva clear, no obvious abnormalities on inspection of external nose and ears, normal appearance of ear canals and TMs, clear nasal congestion, mild post oropharyngeal erythema with PND, no tonsillar edema or exudate, no sinus TTP  NECK: no obvious masses on inspection  LUNGS: clear to auscultation bilaterally, no wheezes, rales or rhonchi, good air movement  CV: HRRR,  no peripheral edema  MS: moves all extremities without noticeable abnormality  PSYCH: pleasant and cooperative, no obvious depression or anxiety  ASSESSMENT AND PLAN:  Discussed the following assessment and plan:  VIRAL URI - Plan: benzonatate (TESSALON) 100 MG capsule  -given HPI and exam findings today, a serious infection or illness is unlikely. We discussed potential etiologies, with VURI being most likely, and advised supportive care and monitoring. We discussed treatment side effects, likely course, antibiotic misuse, transmission, and signs of developing a serious illness. -of course, we advised to return or notify a doctor immediately if symptoms worsen or persist or new concerns arise.    There are no Patient Instructions on file for this visit.   Kriste Basque R.

## 2013-01-08 NOTE — Progress Notes (Signed)
Pre visit review using our clinic review tool, if applicable. No additional management support is needed unless otherwise documented below in the visit note. 

## 2013-03-18 ENCOUNTER — Ambulatory Visit (INDEPENDENT_AMBULATORY_CARE_PROVIDER_SITE_OTHER): Payer: Medicare Other | Admitting: Family Medicine

## 2013-03-18 ENCOUNTER — Encounter: Payer: Self-pay | Admitting: Family Medicine

## 2013-03-18 VITALS — BP 124/80 | HR 104 | Temp 98.5°F | Wt 166.0 lb

## 2013-03-18 DIAGNOSIS — J309 Allergic rhinitis, unspecified: Secondary | ICD-10-CM | POA: Diagnosis not present

## 2013-03-18 DIAGNOSIS — J45909 Unspecified asthma, uncomplicated: Secondary | ICD-10-CM | POA: Diagnosis not present

## 2013-03-18 DIAGNOSIS — J069 Acute upper respiratory infection, unspecified: Secondary | ICD-10-CM | POA: Diagnosis not present

## 2013-03-18 MED ORDER — PREDNISONE 20 MG PO TABS: ORAL_TABLET | ORAL | Status: DC

## 2013-03-18 MED ORDER — HYDROCODONE-HOMATROPINE 5-1.5 MG/5ML PO SYRP: 5.0000 mL | ORAL_SOLUTION | Freq: Three times a day (TID) | ORAL | Status: DC | PRN

## 2013-03-18 NOTE — Patient Instructions (Signed)
Drink lots of water  Vaporizer  Hydromet, 1/2-1 teaspoon 3 times daily. For cough  Prednisone 20 mg,,,,,,,,,,,, 3 tabs now then 2 tabs x3 days, 1 tab x3 days, a half a tab x3 days, then a half a tablet Monday Wednesday Friday for a two-week taper ,,,,,,,,

## 2013-03-18 NOTE — Progress Notes (Signed)
   Subjective:    Patient ID: Desiree Spencer, female    DOB: 1941/01/15, 73 y.o.   MRN: [id number]  HPI Desiree Spencer is a 73 year old married female nonsmoker who comes in today for evaluation of a cold for one week  She's a week ago she developed a cold typically head congestion running nose and cough. No fever no sputum production. 3 days ago she began wheezing. She has a history of underlying allergic rhinitis and asthma.   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

## 2013-03-18 NOTE — Progress Notes (Signed)
Pre visit review using our clinic review tool, if applicable. No additional management support is needed unless otherwise documented below in the visit note. 

## 2013-05-27 ENCOUNTER — Ambulatory Visit (INDEPENDENT_AMBULATORY_CARE_PROVIDER_SITE_OTHER): Payer: Medicare Other | Admitting: Family Medicine

## 2013-05-27 ENCOUNTER — Encounter: Payer: Self-pay | Admitting: Family Medicine

## 2013-05-27 VITALS — BP 120/84 | Temp 97.7°F | Wt 162.0 lb

## 2013-05-27 DIAGNOSIS — N3000 Acute cystitis without hematuria: Secondary | ICD-10-CM

## 2013-05-27 DIAGNOSIS — R300 Dysuria: Secondary | ICD-10-CM | POA: Diagnosis not present

## 2013-05-27 LAB — POCT URINALYSIS DIPSTICK
BILIRUBIN UA: NEGATIVE
Glucose, UA: NEGATIVE
Ketones, UA: NEGATIVE
Nitrite, UA: NEGATIVE
PH UA: 6.5
Protein, UA: NEGATIVE
RBC UA: NEGATIVE
Spec Grav, UA: 1.01
Urobilinogen, UA: 0.2

## 2013-05-27 MED ORDER — SULFAMETHOXAZOLE-TMP DS 800-160 MG PO TABS: 1.0000 | ORAL_TABLET | Freq: Two times a day (BID) | ORAL | Status: DC

## 2013-05-27 NOTE — Progress Notes (Signed)
Pre visit review using our clinic review tool, if applicable. No additional management support is needed unless otherwise documented below in the visit note. 

## 2013-05-27 NOTE — Progress Notes (Signed)
   Subjective:    Patient ID: Desiree Spencer, female    DOB: [birth year], 72 y.o.   MRN: [id number]  HPI Fayelynn is a 73 year old female nonsmoker who comes in with a days history of urinary frequency and dysuria  She has a fever chills or back pain. She has urinary tract infection once or twice yearly.  Review of systems we can 9 findings precipitating cause   Review of Systems    negative Objective:   Physical Exam  Well-developed well nourished female no acute distress vital signs stable she is afebrile urinalysis shows white cells      Assessment & Plan:  UTI plan treat with Septra

## 2013-05-27 NOTE — Patient Instructions (Signed)
Drink lots of water....... 32 ounces daily  Septra DS........ one twice daily until the bottle is empty......Marland Kitchen refills x2.

## 2013-09-21 ENCOUNTER — Encounter: Payer: Medicare Other | Admitting: Family Medicine

## 2013-09-27 ENCOUNTER — Encounter: Payer: Self-pay | Admitting: Family Medicine

## 2013-09-27 ENCOUNTER — Ambulatory Visit (INDEPENDENT_AMBULATORY_CARE_PROVIDER_SITE_OTHER): Payer: Medicare Other | Admitting: Family Medicine

## 2013-09-27 VITALS — BP 140/80 | Temp 98.0°F | Ht 64.75 in | Wt 164.0 lb

## 2013-09-27 DIAGNOSIS — I10 Essential (primary) hypertension: Secondary | ICD-10-CM | POA: Diagnosis not present

## 2013-09-27 DIAGNOSIS — E785 Hyperlipidemia, unspecified: Secondary | ICD-10-CM | POA: Diagnosis not present

## 2013-09-27 DIAGNOSIS — Z820 Family history of epilepsy and other diseases of the nervous system: Secondary | ICD-10-CM

## 2013-09-27 DIAGNOSIS — Z23 Encounter for immunization: Secondary | ICD-10-CM

## 2013-09-27 LAB — CBC WITH DIFFERENTIAL/PLATELET
Basophils Absolute: 0 10*3/uL (ref 0.0–0.1)
Basophils Relative: 0.6 % (ref 0.0–3.0)
EOS PCT: 2.1 % (ref 0.0–5.0)
Eosinophils Absolute: 0.2 10*3/uL (ref 0.0–0.7)
HEMATOCRIT: 42.5 % (ref 36.0–46.0)
HEMOGLOBIN: 14.4 g/dL (ref 12.0–15.0)
Lymphocytes Relative: 40.3 % (ref 12.0–46.0)
Lymphs Abs: 3.1 10*3/uL (ref 0.7–4.0)
MCHC: 33.9 g/dL (ref 30.0–36.0)
MCV: 84.4 fl (ref 78.0–100.0)
MONOS PCT: 7.2 % (ref 3.0–12.0)
Monocytes Absolute: 0.6 10*3/uL (ref 0.1–1.0)
NEUTROS ABS: 3.9 10*3/uL (ref 1.4–7.7)
Neutrophils Relative %: 49.8 % (ref 43.0–77.0)
Platelets: 264 10*3/uL (ref 150.0–400.0)
RBC: 5.04 Mil/uL (ref 3.87–5.11)
RDW: 13.5 % (ref 11.5–15.5)
WBC: 7.8 10*3/uL (ref 4.0–10.5)

## 2013-09-27 LAB — POCT URINALYSIS DIPSTICK
BILIRUBIN UA: NEGATIVE
Glucose, UA: NEGATIVE
KETONES UA: NEGATIVE
LEUKOCYTES UA: NEGATIVE
Nitrite, UA: NEGATIVE
PH UA: 7
Protein, UA: NEGATIVE
RBC UA: NEGATIVE
Spec Grav, UA: 1.015
Urobilinogen, UA: 0.2

## 2013-09-27 LAB — BASIC METABOLIC PANEL
BUN: 19 mg/dL (ref 6–23)
CHLORIDE: 98 meq/L (ref 96–112)
CO2: 28 mEq/L (ref 19–32)
Calcium: 10.3 mg/dL (ref 8.4–10.5)
Creatinine, Ser: 0.8 mg/dL (ref 0.4–1.2)
GFR: 71.59 mL/min (ref >60.00)
Glucose, Bld: 99 mg/dL (ref 70–99)
POTASSIUM: 4.7 meq/L (ref 3.5–5.1)
SODIUM: 136 meq/L (ref 135–145)

## 2013-09-27 LAB — TSH: TSH: 1.54 u[IU]/mL (ref 0.35–4.50)

## 2013-09-27 MED ORDER — SUMATRIPTAN SUCCINATE 25 MG PO TABS
25.0000 mg | ORAL_TABLET | ORAL | Status: DC | PRN
Start: 2013-09-27 — End: 2014-11-21

## 2013-09-27 MED ORDER — AMITRIPTYLINE HCL 50 MG PO TABS: 50.0000 mg | ORAL_TABLET | Freq: Every day | ORAL | Status: DC

## 2013-09-27 MED ORDER — HYDROCHLOROTHIAZIDE 25 MG PO TABS: 25.0000 mg | ORAL_TABLET | Freq: Every day | ORAL | Status: DC

## 2013-09-27 NOTE — Patient Instructions (Signed)
Continue your current medications  Followup in 1 year sooner if any problems  Remember to walk 30 minutes daily  Return when necessary for removal of the cystic lesion on your scalp

## 2013-09-27 NOTE — Progress Notes (Signed)
Pre visit review using our clinic review tool, if applicable. No additional management support is needed unless otherwise documented below in the visit note. 

## 2013-09-27 NOTE — Progress Notes (Signed)
   Subjective:    Patient ID: Desiree Spencer, female    DOB: 07/02/1940, 73 y.o.   MRN: [id number]  HPI [name] is a 73 year old female who comes in today for evaluation of sleep dysfunction, mild hypertension, and migraine headaches  Should her uterus and ovaries removed many years ago for nonmalignant reasons or for pelvic exams are no longer indicated  She gets routine eye care, dental care, BSE monthly, mammography every 6 months because of a calcium deposit left breast. Colonoscopy and GI have all been normal. Vaccinations updated by Apolonio Schneiders  Cognitive function normal she walks daily home health safety reviewed no issues identified, no guns in the house, she does have a health care power of attorney and living well   Review of Systems  Constitutional: Negative.   HENT: Negative.   Eyes: Negative.   Respiratory: Negative.   Cardiovascular: Negative.   Gastrointestinal: Negative.   Genitourinary: Negative.   Musculoskeletal: Negative.   Neurological: Negative.   Psychiatric/Behavioral: Negative.        Objective:   Physical Exam  Nursing note and vitals reviewed. Constitutional: She appears well-developed and well-nourished.  HENT:  Head: Normocephalic and atraumatic.  Right Ear: External ear normal.  Left Ear: External ear normal.  Nose: Nose normal.  Mouth/Throat: Oropharynx is clear and moist.  Eyes: EOM are normal. Pupils are equal, round, and reactive to light.  Neck: Normal range of motion. Neck supple. No thyromegaly present.  Cardiovascular: Normal rate, regular rhythm, normal heart sounds and intact distal pulses.  Exam reveals no gallop and no friction rub.   No murmur heard. Pulmonary/Chest: Effort normal and breath sounds normal.  Abdominal: Soft. Bowel sounds are normal. She exhibits no distension and no mass. There is no tenderness. There is no rebound.  Genitourinary:  Bilateral breast exam normal  Musculoskeletal: Normal range of motion.    Lymphadenopathy:    She has no cervical adenopathy.  Neurological: She is alert. She has normal reflexes. No cranial nerve deficit. She exhibits normal muscle tone. Coordination normal.  Skin: Skin is warm and dry.  Total body skin exam normal  Psychiatric: She has a normal mood and affect. Her behavior is normal. Judgment and thought content normal.          Assessment & Plan:  Healthy female  Hypertension at goal continue hydrochlorothiazide 25 mg daily  Sleep dysfunction continue Elavil but increase dose to 50 mg daily  History of migraine headaches refill Imitrex

## 2013-09-28 ENCOUNTER — Telehealth: Payer: Self-pay | Admitting: Family Medicine

## 2013-09-28 NOTE — Telephone Encounter (Signed)
Relevant patient education assigned to patient using Emmi. ° °

## 2013-10-07 DIAGNOSIS — Z1231 Encounter for screening mammogram for malignant neoplasm of breast: Secondary | ICD-10-CM | POA: Diagnosis not present

## 2013-11-01 DIAGNOSIS — Z23 Encounter for immunization: Secondary | ICD-10-CM | POA: Diagnosis not present

## 2014-07-04 ENCOUNTER — Ambulatory Visit (INDEPENDENT_AMBULATORY_CARE_PROVIDER_SITE_OTHER): Payer: Medicare Other | Admitting: Family Medicine

## 2014-07-04 ENCOUNTER — Encounter: Payer: Self-pay | Admitting: Family Medicine

## 2014-07-04 VITALS — BP 140/90 | Temp 98.4°F | Wt 162.0 lb

## 2014-07-04 DIAGNOSIS — G4700 Insomnia, unspecified: Secondary | ICD-10-CM

## 2014-07-04 MED ORDER — LORAZEPAM 0.5 MG PO TABS: ORAL_TABLET | ORAL | Status: DC

## 2014-07-04 MED ORDER — TRAZODONE HCL 50 MG PO TABS: ORAL_TABLET | ORAL | Status: DC

## 2014-07-04 NOTE — Progress Notes (Signed)
Pre visit review using our clinic review tool, if applicable. No additional management support is needed unless otherwise documented below in the visit note. 

## 2014-07-04 NOTE — Progress Notes (Signed)
   Subjective:    Patient ID: Desiree Spencer, female    DOB: 1940-07-16, 74 y.o.   MRN: [id number]  HPI Desiree Spencer is a 74 year old female nonsmoker who comes in today for evaluation of insomnia  She was on Elavil 50 mg was sleeping fairly well but had side effects of dry mouth and memory loss. She stopped about a month ago. She's here to discuss options  We discussed the other medical options we also discussed referring to Dr. Arvil Persons group for further evaluation. She says she wants to try the trazodone first   Review of Systems    review of systems otherwise Objective:   Physical Exam  Well-developed well-nourished female no acute distress vital signs stable she's afebrile      Assessment & Plan:  Insomnia,,,,,,,,, begin trazodone 50 mg at bedtime Ativan 0.5 as a crutch for 4-6 weeks until the trazodone works  Return when necessary

## 2014-07-04 NOTE — Patient Instructions (Addendum)
Trazodone 50 mg.......Marland Kitchen 1 at bedtime  Ativan 0.5......Marland Kitchen 1/2-1 tablet at bedtime when necessary  If you see improvement but would like to increase the dose of trazodone to 75 mg daily call Desiree Spencer extension [phone]  Desiree Spencer,,,,,,,,, our new adult nurse practitioner who we taking over for me after June 1

## 2014-08-17 ENCOUNTER — Telehealth: Payer: Self-pay | Admitting: *Deleted

## 2014-08-17 MED ORDER — TRAZODONE HCL 100 MG PO TABS: 100.0000 mg | ORAL_TABLET | Freq: Every day | ORAL | Status: DC

## 2014-08-17 NOTE — Telephone Encounter (Signed)
Patient is requesting a increase of Trazodone from 50 mg to 100 mg. Okay per Dr Sherren Mocha

## 2014-09-10 DIAGNOSIS — J209 Acute bronchitis, unspecified: Secondary | ICD-10-CM | POA: Diagnosis not present

## 2014-09-16 DIAGNOSIS — J209 Acute bronchitis, unspecified: Secondary | ICD-10-CM | POA: Diagnosis not present

## 2014-09-16 DIAGNOSIS — R490 Dysphonia: Secondary | ICD-10-CM | POA: Diagnosis not present

## 2014-09-23 ENCOUNTER — Ambulatory Visit (INDEPENDENT_AMBULATORY_CARE_PROVIDER_SITE_OTHER): Payer: Medicare Other | Admitting: Family Medicine

## 2014-09-23 ENCOUNTER — Encounter: Payer: Self-pay | Admitting: Family Medicine

## 2014-09-23 VITALS — BP 130/80 | HR 90 | Temp 97.8°F | Wt 149.0 lb

## 2014-09-23 DIAGNOSIS — R05 Cough: Secondary | ICD-10-CM

## 2014-09-23 DIAGNOSIS — J040 Acute laryngitis: Secondary | ICD-10-CM

## 2014-09-23 DIAGNOSIS — R059 Cough, unspecified: Secondary | ICD-10-CM

## 2014-09-23 NOTE — Patient Instructions (Signed)
Laryngitis At the top of your windpipe is your voice box. It is the source of your voice. Inside your voice box are 2 bands of muscles called vocal cords. When you breathe, your vocal cords are relaxed and open so that air can get into the lungs. When you decide to say something, these cords come together and vibrate. The sound from these vibrations goes into your throat and comes out through your mouth as sound. Laryngitis is an inflammation of the vocal cords that causes hoarseness, cough, loss of voice, sore throat, and dry throat. Laryngitis can be temporary (acute) or long-term (chronic). Most cases of acute laryngitis improve with time.Chronic laryngitis lasts for more than 3 weeks. CAUSES Laryngitis can often be related to excessive smoking, talking, or yelling, as well as inhalation of toxic fumes and allergies. Acute laryngitis is usually caused by a viral infection, vocal strain, measles or mumps, or bacterial infections. Chronic laryngitis is usually caused by vocal cord strain, vocal cord injury, postnasal drip, growths on the vocal cords, or acid reflux. SYMPTOMS   Cough.  Sore throat.  Dry throat. RISK FACTORS  Respiratory infections.  Exposure to irritating substances, such as cigarette smoke, excessive amounts of alcohol, stomach acids, and workplace chemicals.  Voice trauma, such as vocal cord injury from shouting or speaking too loud. DIAGNOSIS  Your cargiver will perform a physical exam. During the physical exam, your caregiver will examine your throat. The most common sign of laryngitis is hoarseness. Laryngoscopy may be necessary to confirm the diagnosis of this condition. This procedure allows your caregiver to look into the larynx. HOME CARE INSTRUCTIONS  Drink enough fluids to keep your urine clear or pale yellow.  Rest until you no longer have symptoms or as directed by your caregiver.  Breathe in moist air.  Take all medicine as directed by your  caregiver.  Do not smoke.  Talk as little as possible (this includes whispering).  Write on paper instead of talking until your voice is back to normal.  Follow up with your caregiver if your condition has not improved after 10 days. SEEK MEDICAL CARE IF:   You have trouble breathing.  You cough up blood.  You have persistent fever.  You have increasing pain.  You have difficulty swallowing. MAKE SURE YOU:  Understand these instructions.  Will watch your condition.  Will get help right away if you are not doing well or get worse. Document Released: 02/04/2005 Document Revised: 04/29/2011 Document Reviewed: 04/12/2010 [facility] Patient Information 2015 [place], [place]. This information is not intended to replace advice given to you by your health care provider. Make sure you discuss any questions you have with your health care provider.  Touch base in one week if laryngitis not resolving.

## 2014-09-23 NOTE — Progress Notes (Signed)
   Subjective:    Patient ID: Desiree Spencer, female    DOB: 1940/09/13, 74 y.o.   MRN: [id number]  HPI Patient seen with 2 week history of cough and laryngitis symptoms. She had initially developed some colored nasal discharge and left maxillary sinus pressure along with symptoms above. She went to urgent care 2 weeks ago and prescribed doxycycline and given Depo-Medrol injection and codeine cough syrup. Her cough is somewhat improved. Her left maxillary facial pain has resolved. She's had 2 weeks of laryngitis symptoms. No GERD symptoms. Denies postnasal drip. Does have some increased malaise. Nonsmoker. She is in the process of moving around lots of dust but generally does not have dust allergies. No sneezing.  Past Medical History  Diagnosis Date  . HYPERLIPIDEMIA 07/01/2008  . HYPERTENSION 08/14/2006  . ALLERGIC RHINITIS 08/14/2006  . Extrinsic asthma, unspecified 03/30/2007  . GERD 08/14/2006  . DIVERTICULITIS, ACUTE 06/07/2008  . Acute cystitis 07/22/2007  . PILAR CYST 07/20/2008  . OSTEOARTHRITIS 08/14/2006  . [place] DISEASE, LUMBAR 08/07/2009  . ROTATOR CUFF INJURY, RIGHT SHOULDER 11/11/2008  . HEPATITIS B, HX OF 08/14/2006   Past Surgical History  Procedure Laterality Date  . Childbirth      X 2  . Cholecystectomy    . Abdominal hysterectomy      reports that she has never smoked. She does not have any smokeless tobacco history on file. Her alcohol and drug histories are not on file. family history includes Hyperlipidemia in her other; Hypertension in her other. No Known Allergies    Review of Systems  Constitutional: Negative for fever and chills.  HENT: Positive for voice change. Negative for congestion, sinus pressure, sore throat and trouble swallowing.   Respiratory: Positive for cough.   Cardiovascular: Negative for chest pain.       Objective:   Physical Exam  Constitutional: She appears well-developed and well-nourished. No distress.  HENT:  Right Ear: External ear  normal.  Left Ear: External ear normal.  Mouth/Throat: Oropharynx is clear and moist.  Neck: Neck supple.  Cardiovascular: Normal rate and regular rhythm.   Pulmonary/Chest: Effort normal and breath sounds normal. No respiratory distress. She has no wheezes. She has no rales.  Lymphadenopathy:    She has no cervical adenopathy.          Assessment & Plan:  Patient presents with some persistent dry cough and laryngitis symptoms following what sounds like recent viral URI. No history of smoking. Nonfocal exam. Her cough is some improved. Recommended observation. She'll try some hot liquids such as hot tea. Touch base in one week if laryngitis not resolving. Consider ENT referral then if not improved

## 2014-09-23 NOTE — Progress Notes (Signed)
Pre visit review using our clinic review tool, if applicable. No additional management support is needed unless otherwise documented below in the visit note. 

## 2014-10-03 ENCOUNTER — Telehealth: Payer: Self-pay | Admitting: Family Medicine

## 2014-10-03 NOTE — Telephone Encounter (Signed)
FYI Pt saw dr Elease Hashimoto on 8-5 is calling to report her laryngitis is much better.

## 2014-10-04 DIAGNOSIS — M4726 Other spondylosis with radiculopathy, lumbar region: Secondary | ICD-10-CM | POA: Diagnosis not present

## 2014-10-04 DIAGNOSIS — M9902 Segmental and somatic dysfunction of thoracic region: Secondary | ICD-10-CM | POA: Diagnosis not present

## 2014-10-04 DIAGNOSIS — M9901 Segmental and somatic dysfunction of cervical region: Secondary | ICD-10-CM | POA: Diagnosis not present

## 2014-10-04 DIAGNOSIS — M9903 Segmental and somatic dysfunction of lumbar region: Secondary | ICD-10-CM | POA: Diagnosis not present

## 2014-10-05 DIAGNOSIS — M4726 Other spondylosis with radiculopathy, lumbar region: Secondary | ICD-10-CM | POA: Diagnosis not present

## 2014-10-05 DIAGNOSIS — M9903 Segmental and somatic dysfunction of lumbar region: Secondary | ICD-10-CM | POA: Diagnosis not present

## 2014-10-05 DIAGNOSIS — M9902 Segmental and somatic dysfunction of thoracic region: Secondary | ICD-10-CM | POA: Diagnosis not present

## 2014-10-05 DIAGNOSIS — M9901 Segmental and somatic dysfunction of cervical region: Secondary | ICD-10-CM | POA: Diagnosis not present

## 2014-10-10 DIAGNOSIS — M9901 Segmental and somatic dysfunction of cervical region: Secondary | ICD-10-CM | POA: Diagnosis not present

## 2014-10-10 DIAGNOSIS — M9903 Segmental and somatic dysfunction of lumbar region: Secondary | ICD-10-CM | POA: Diagnosis not present

## 2014-10-10 DIAGNOSIS — M9902 Segmental and somatic dysfunction of thoracic region: Secondary | ICD-10-CM | POA: Diagnosis not present

## 2014-10-10 DIAGNOSIS — M4726 Other spondylosis with radiculopathy, lumbar region: Secondary | ICD-10-CM | POA: Diagnosis not present

## 2014-10-11 DIAGNOSIS — Z1231 Encounter for screening mammogram for malignant neoplasm of breast: Secondary | ICD-10-CM | POA: Diagnosis not present

## 2014-10-11 LAB — HM MAMMOGRAPHY: HM MAMMO: NORMAL

## 2014-10-12 ENCOUNTER — Encounter: Payer: Self-pay | Admitting: Family Medicine

## 2014-10-12 DIAGNOSIS — M9902 Segmental and somatic dysfunction of thoracic region: Secondary | ICD-10-CM | POA: Diagnosis not present

## 2014-10-12 DIAGNOSIS — M9901 Segmental and somatic dysfunction of cervical region: Secondary | ICD-10-CM | POA: Diagnosis not present

## 2014-10-12 DIAGNOSIS — M4726 Other spondylosis with radiculopathy, lumbar region: Secondary | ICD-10-CM | POA: Diagnosis not present

## 2014-10-12 DIAGNOSIS — M9903 Segmental and somatic dysfunction of lumbar region: Secondary | ICD-10-CM | POA: Diagnosis not present

## 2014-10-17 DIAGNOSIS — M9901 Segmental and somatic dysfunction of cervical region: Secondary | ICD-10-CM | POA: Diagnosis not present

## 2014-10-17 DIAGNOSIS — M9903 Segmental and somatic dysfunction of lumbar region: Secondary | ICD-10-CM | POA: Diagnosis not present

## 2014-10-17 DIAGNOSIS — M9902 Segmental and somatic dysfunction of thoracic region: Secondary | ICD-10-CM | POA: Diagnosis not present

## 2014-10-17 DIAGNOSIS — M4726 Other spondylosis with radiculopathy, lumbar region: Secondary | ICD-10-CM | POA: Diagnosis not present

## 2014-10-19 DIAGNOSIS — M4726 Other spondylosis with radiculopathy, lumbar region: Secondary | ICD-10-CM | POA: Diagnosis not present

## 2014-10-19 DIAGNOSIS — M9901 Segmental and somatic dysfunction of cervical region: Secondary | ICD-10-CM | POA: Diagnosis not present

## 2014-10-19 DIAGNOSIS — M9902 Segmental and somatic dysfunction of thoracic region: Secondary | ICD-10-CM | POA: Diagnosis not present

## 2014-10-19 DIAGNOSIS — M9903 Segmental and somatic dysfunction of lumbar region: Secondary | ICD-10-CM | POA: Diagnosis not present

## 2014-10-25 DIAGNOSIS — M9902 Segmental and somatic dysfunction of thoracic region: Secondary | ICD-10-CM | POA: Diagnosis not present

## 2014-10-25 DIAGNOSIS — M9901 Segmental and somatic dysfunction of cervical region: Secondary | ICD-10-CM | POA: Diagnosis not present

## 2014-10-25 DIAGNOSIS — M4726 Other spondylosis with radiculopathy, lumbar region: Secondary | ICD-10-CM | POA: Diagnosis not present

## 2014-10-25 DIAGNOSIS — M9903 Segmental and somatic dysfunction of lumbar region: Secondary | ICD-10-CM | POA: Diagnosis not present

## 2014-10-31 DIAGNOSIS — M9903 Segmental and somatic dysfunction of lumbar region: Secondary | ICD-10-CM | POA: Diagnosis not present

## 2014-10-31 DIAGNOSIS — M4726 Other spondylosis with radiculopathy, lumbar region: Secondary | ICD-10-CM | POA: Diagnosis not present

## 2014-10-31 DIAGNOSIS — M9902 Segmental and somatic dysfunction of thoracic region: Secondary | ICD-10-CM | POA: Diagnosis not present

## 2014-10-31 DIAGNOSIS — M9901 Segmental and somatic dysfunction of cervical region: Secondary | ICD-10-CM | POA: Diagnosis not present

## 2014-11-02 DIAGNOSIS — M9903 Segmental and somatic dysfunction of lumbar region: Secondary | ICD-10-CM | POA: Diagnosis not present

## 2014-11-02 DIAGNOSIS — M4726 Other spondylosis with radiculopathy, lumbar region: Secondary | ICD-10-CM | POA: Diagnosis not present

## 2014-11-02 DIAGNOSIS — M9902 Segmental and somatic dysfunction of thoracic region: Secondary | ICD-10-CM | POA: Diagnosis not present

## 2014-11-02 DIAGNOSIS — M9901 Segmental and somatic dysfunction of cervical region: Secondary | ICD-10-CM | POA: Diagnosis not present

## 2014-11-07 DIAGNOSIS — M9901 Segmental and somatic dysfunction of cervical region: Secondary | ICD-10-CM | POA: Diagnosis not present

## 2014-11-07 DIAGNOSIS — M9903 Segmental and somatic dysfunction of lumbar region: Secondary | ICD-10-CM | POA: Diagnosis not present

## 2014-11-07 DIAGNOSIS — M4726 Other spondylosis with radiculopathy, lumbar region: Secondary | ICD-10-CM | POA: Diagnosis not present

## 2014-11-07 DIAGNOSIS — M9902 Segmental and somatic dysfunction of thoracic region: Secondary | ICD-10-CM | POA: Diagnosis not present

## 2014-11-09 DIAGNOSIS — M4726 Other spondylosis with radiculopathy, lumbar region: Secondary | ICD-10-CM | POA: Diagnosis not present

## 2014-11-09 DIAGNOSIS — M9901 Segmental and somatic dysfunction of cervical region: Secondary | ICD-10-CM | POA: Diagnosis not present

## 2014-11-09 DIAGNOSIS — M9903 Segmental and somatic dysfunction of lumbar region: Secondary | ICD-10-CM | POA: Diagnosis not present

## 2014-11-09 DIAGNOSIS — M9902 Segmental and somatic dysfunction of thoracic region: Secondary | ICD-10-CM | POA: Diagnosis not present

## 2014-11-14 ENCOUNTER — Other Ambulatory Visit (INDEPENDENT_AMBULATORY_CARE_PROVIDER_SITE_OTHER): Payer: Medicare Other

## 2014-11-14 DIAGNOSIS — M9902 Segmental and somatic dysfunction of thoracic region: Secondary | ICD-10-CM | POA: Diagnosis not present

## 2014-11-14 DIAGNOSIS — I10 Essential (primary) hypertension: Secondary | ICD-10-CM | POA: Diagnosis not present

## 2014-11-14 DIAGNOSIS — M4726 Other spondylosis with radiculopathy, lumbar region: Secondary | ICD-10-CM | POA: Diagnosis not present

## 2014-11-14 DIAGNOSIS — M9901 Segmental and somatic dysfunction of cervical region: Secondary | ICD-10-CM | POA: Diagnosis not present

## 2014-11-14 DIAGNOSIS — E785 Hyperlipidemia, unspecified: Secondary | ICD-10-CM | POA: Diagnosis not present

## 2014-11-14 DIAGNOSIS — M9903 Segmental and somatic dysfunction of lumbar region: Secondary | ICD-10-CM | POA: Diagnosis not present

## 2014-11-14 DIAGNOSIS — Z0000 Encounter for general adult medical examination without abnormal findings: Secondary | ICD-10-CM | POA: Diagnosis not present

## 2014-11-14 LAB — CBC WITH DIFFERENTIAL/PLATELET
BASOS ABS: 0 10*3/uL (ref 0.0–0.1)
BASOS PCT: 0.6 % (ref 0.0–3.0)
EOS ABS: 0.2 10*3/uL (ref 0.0–0.7)
Eosinophils Relative: 2.9 % (ref 0.0–5.0)
HCT: 41.3 % (ref 36.0–46.0)
HEMOGLOBIN: 13.9 g/dL (ref 12.0–15.0)
Lymphocytes Relative: 50.1 % — ABNORMAL HIGH (ref 12.0–46.0)
Lymphs Abs: 3.1 10*3/uL (ref 0.7–4.0)
MCHC: 33.8 g/dL (ref 30.0–36.0)
MCV: 84.4 fl (ref 78.0–100.0)
MONO ABS: 0.5 10*3/uL (ref 0.1–1.0)
Monocytes Relative: 7.8 % (ref 3.0–12.0)
NEUTROS ABS: 2.4 10*3/uL (ref 1.4–7.7)
Neutrophils Relative %: 38.6 % — ABNORMAL LOW (ref 43.0–77.0)
PLATELETS: 243 10*3/uL (ref 150.0–400.0)
RBC: 4.89 Mil/uL (ref 3.87–5.11)
RDW: 14.1 % (ref 11.5–15.5)
WBC: 6.2 10*3/uL (ref 4.0–10.5)

## 2014-11-14 LAB — POCT URINALYSIS DIPSTICK
Bilirubin, UA: NEGATIVE
Blood, UA: NEGATIVE
GLUCOSE UA: NEGATIVE
Ketones, UA: NEGATIVE
Nitrite, UA: NEGATIVE
PROTEIN UA: NEGATIVE
Spec Grav, UA: 1.015
UROBILINOGEN UA: 0.2
pH, UA: 7

## 2014-11-14 LAB — HEPATIC FUNCTION PANEL
ALK PHOS: 65 U/L (ref 39–117)
ALT: 14 U/L (ref 0–35)
AST: 17 U/L (ref 0–37)
Albumin: 3.9 g/dL (ref 3.5–5.2)
BILIRUBIN DIRECT: 0.1 mg/dL (ref 0.0–0.3)
BILIRUBIN TOTAL: 0.5 mg/dL (ref 0.2–1.2)
Total Protein: 6.8 g/dL (ref 6.0–8.3)

## 2014-11-14 LAB — LIPID PANEL
CHOLESTEROL: 235 mg/dL — AB (ref 0–200)
HDL: 55.5 mg/dL (ref >39.00)
LDL Cholesterol: 161 mg/dL — ABNORMAL HIGH (ref 0–99)
NonHDL: 179.28
TRIGLYCERIDES: 91 mg/dL (ref 0.0–149.0)
Total CHOL/HDL Ratio: 4
VLDL: 18.2 mg/dL (ref 0.0–40.0)

## 2014-11-14 LAB — BASIC METABOLIC PANEL
BUN: 17 mg/dL (ref 6–23)
CALCIUM: 9.5 mg/dL (ref 8.4–10.5)
CHLORIDE: 100 meq/L (ref 96–112)
CO2: 29 mEq/L (ref 19–32)
CREATININE: 0.85 mg/dL (ref 0.40–1.20)
GFR: 69.43 mL/min (ref >60.00)
Glucose, Bld: 101 mg/dL — ABNORMAL HIGH (ref 70–99)
Potassium: 3.4 mEq/L — ABNORMAL LOW (ref 3.5–5.1)
Sodium: 138 mEq/L (ref 135–145)

## 2014-11-14 LAB — TSH: TSH: 1.71 u[IU]/mL (ref 0.35–4.50)

## 2014-11-16 DIAGNOSIS — M9903 Segmental and somatic dysfunction of lumbar region: Secondary | ICD-10-CM | POA: Diagnosis not present

## 2014-11-16 DIAGNOSIS — M4726 Other spondylosis with radiculopathy, lumbar region: Secondary | ICD-10-CM | POA: Diagnosis not present

## 2014-11-16 DIAGNOSIS — M9901 Segmental and somatic dysfunction of cervical region: Secondary | ICD-10-CM | POA: Diagnosis not present

## 2014-11-16 DIAGNOSIS — M9902 Segmental and somatic dysfunction of thoracic region: Secondary | ICD-10-CM | POA: Diagnosis not present

## 2014-11-21 ENCOUNTER — Encounter: Payer: Self-pay | Admitting: Family Medicine

## 2014-11-21 ENCOUNTER — Ambulatory Visit (INDEPENDENT_AMBULATORY_CARE_PROVIDER_SITE_OTHER): Payer: Medicare Other | Admitting: Family Medicine

## 2014-11-21 VITALS — BP 120/84 | Temp 98.1°F | Ht 64.5 in | Wt 146.0 lb

## 2014-11-21 DIAGNOSIS — Z0000 Encounter for general adult medical examination without abnormal findings: Secondary | ICD-10-CM | POA: Diagnosis not present

## 2014-11-21 DIAGNOSIS — Z23 Encounter for immunization: Secondary | ICD-10-CM | POA: Diagnosis not present

## 2014-11-21 DIAGNOSIS — G4700 Insomnia, unspecified: Secondary | ICD-10-CM | POA: Diagnosis not present

## 2014-11-21 DIAGNOSIS — I10 Essential (primary) hypertension: Secondary | ICD-10-CM | POA: Diagnosis not present

## 2014-11-21 DIAGNOSIS — E785 Hyperlipidemia, unspecified: Secondary | ICD-10-CM | POA: Diagnosis not present

## 2014-11-21 MED ORDER — HYDROCHLOROTHIAZIDE 25 MG PO TABS: 25.0000 mg | ORAL_TABLET | Freq: Every day | ORAL | Status: DC

## 2014-11-21 MED ORDER — TRAZODONE HCL 100 MG PO TABS: ORAL_TABLET | ORAL | Status: DC

## 2014-11-21 NOTE — Patient Instructions (Signed)
Trazodone 100 mg........ 2 tabs at bedtime  Avoid caffeine completely  Walk 30 minutes daily in the morning  If the above does not help your sleep dysfunction you might consider consult with Dr. Tomasita Crumble [facility]'s group  Return in one year sooner if any problems  Beaulah Dinning,,,,,,, our new adult nurse practitioner from [facility]

## 2014-11-21 NOTE — Progress Notes (Signed)
Pre visit review using our clinic review tool, if applicable. No additional management support is needed unless otherwise documented below in the visit note. 

## 2014-11-21 NOTE — Progress Notes (Signed)
   Subjective:    Patient ID: Desiree Spencer, female    DOB: [birth year], 74 y.o.   MRN: [id number]  HPI Rozella is a delightful 74 year old married female nonsmoker who comes in today for general physical examination because of a history of mild hypertension on Hydrocort thiazide 25 mg daily. BP 120/84  She also takes trazodone 150 mg at bedtime and melatonin for sleep dysfunction. She also takes a Benadryl along with it. However she's not sleeping with this medication.  She gets routine eye care, dental care, colonoscopy 2010 normal, BSE monthly, annual mammography,  Flu shot given today other vaccinations up-to-date  Cognitive function normal she walks on a regular basis home health safety reviewed no issues identified, no guns in the house, she does have a healthcare power of attorney and living well   Review of Systems  Constitutional: Negative.   HENT: Negative.   Eyes: Negative.   Respiratory: Negative.   Cardiovascular: Negative.   Gastrointestinal: Negative.   Endocrine: Negative.   Genitourinary: Negative.   Musculoskeletal: Negative.   Skin: Negative.   Allergic/Immunologic: Negative.   Neurological: Negative.   Hematological: Negative.   Psychiatric/Behavioral: Negative.        Objective:   Physical Exam  Constitutional: She appears well-developed and well-nourished.  HENT:  Head: Normocephalic and atraumatic.  Right Ear: External ear normal.  Left Ear: External ear normal.  Nose: Nose normal.  Mouth/Throat: Oropharynx is clear and moist.  Eyes: EOM are normal. Pupils are equal, round, and reactive to light.  Neck: Normal range of motion. Neck supple. No JVD present. No tracheal deviation present. No thyromegaly present.  Cardiovascular: Normal rate, regular rhythm, normal heart sounds and intact distal pulses.  Exam reveals no gallop and no friction rub.   No murmur heard. Pulmonary/Chest: Effort normal and breath sounds normal. No stridor. No respiratory  distress. She has no wheezes. She has no rales. She exhibits no tenderness.  Abdominal: Soft. Bowel sounds are normal. She exhibits no distension and no mass. There is no tenderness. There is no rebound and no guarding.  Genitourinary:  Bilateral breast exam normal    Pelvic and rectal deferred. She had her uterus and nourished new for nonmalignant reasons many years ago. Asymptomatic therefore pelvic exam no longer indicated  Musculoskeletal: Normal range of motion.  Lymphadenopathy:    She has no cervical adenopathy.  Neurological: She is alert. She has normal reflexes. No cranial nerve deficit. She exhibits normal muscle tone. Coordination normal.  Skin: Skin is warm and dry. No rash noted. No erythema. No pallor.  Psychiatric: She has a normal mood and affect. Her behavior is normal. Judgment and thought content normal.  Nursing note and vitals reviewed.         Assessment & Plan:  Healthy female  Mild hypertension........ BP normal on Hydrocort thiazide 25 mg daily  Sleep dysfunction........ increase trazodone to 200 mg daily. Consult with [facility] Sprung group if this does not work

## 2014-12-14 ENCOUNTER — Telehealth: Payer: Self-pay | Admitting: Family Medicine

## 2014-12-14 ENCOUNTER — Encounter: Payer: Self-pay | Admitting: Family

## 2014-12-14 ENCOUNTER — Ambulatory Visit (INDEPENDENT_AMBULATORY_CARE_PROVIDER_SITE_OTHER): Payer: Medicare Other | Admitting: Family

## 2014-12-14 VITALS — BP 130/64 | Temp 98.4°F | Wt 146.0 lb

## 2014-12-14 DIAGNOSIS — R1032 Left lower quadrant pain: Secondary | ICD-10-CM

## 2014-12-14 DIAGNOSIS — K5732 Diverticulitis of large intestine without perforation or abscess without bleeding: Secondary | ICD-10-CM

## 2014-12-14 MED ORDER — LEVOFLOXACIN 500 MG PO TABS: 500.0000 mg | ORAL_TABLET | Freq: Every day | ORAL | Status: DC

## 2014-12-14 NOTE — Progress Notes (Signed)
Subjective:    Patient ID: Desiree Spencer, female    DOB: [birth year], 74 y.o.   MRN: [id number]  HPI  74 year old white female, nonsmoker with a history of diverticulitis presents today with left lower quadrant pain that she rates a 4 out of 10. Reports bowel movements have been stable. Takes a daily stool softener to remain regular. Denies any blood in her stools are dark black stools. Describes an achy pain to her left lower quadrant is been ongoing for 3 days. Patient reports feels like diverticulitis flare.  Review of Systems  Constitutional: Negative.   Respiratory: Negative.   Cardiovascular: Negative.   Gastrointestinal: Positive for abdominal pain.  Endocrine: Negative.   Genitourinary: Negative.   Musculoskeletal: Negative.   Skin: Negative.   Allergic/Immunologic: Negative.   Neurological: Negative.   Psychiatric/Behavioral: Negative.    Past Medical History  Diagnosis Date  . HYPERLIPIDEMIA 07/01/2008  . HYPERTENSION 08/14/2006  . ALLERGIC RHINITIS 08/14/2006  . Extrinsic asthma, unspecified 03/30/2007  . GERD 08/14/2006  . DIVERTICULITIS, ACUTE 06/07/2008  . Acute cystitis 07/22/2007  . PILAR CYST 07/20/2008  . OSTEOARTHRITIS 08/14/2006  . [place] DISEASE, LUMBAR 08/07/2009  . ROTATOR CUFF INJURY, RIGHT SHOULDER 11/11/2008  . HEPATITIS B, HX OF 08/14/2006    Social History   Social History  . Marital Status: Married    Spouse Name: N/A  . Number of Children: N/A  . Years of Education: N/A   Occupational History  . Not on file.   Social History Main Topics  . Smoking status: Never Smoker   . Smokeless tobacco: Not on file  . Alcohol Use: Not on file  . Drug Use: Not on file  . Sexual Activity: Not on file   Other Topics Concern  . Not on file   Social History Narrative    Past Surgical History  Procedure Laterality Date  . Childbirth      X 2  . Cholecystectomy    . Abdominal hysterectomy      Family History  Problem Relation Age of Onset  .  Hyperlipidemia Other   . Hypertension Other     No Known Allergies  Current Outpatient Prescriptions on File Prior to Visit  Medication Sig Dispense Refill  . aspirin 81 MG tablet Take 81 mg by mouth daily.      . Calcium-Magnesium-Vitamin D (CALCIUM 500 PO) Take by mouth.    . fish oil-omega-3 fatty acids 1000 MG capsule Take 1 g by mouth daily.      . fluticasone (FLONASE) 50 MCG/ACT nasal spray Place into both nostrils daily.    . hydrochlorothiazide (HYDRODIURIL) 25 MG tablet Take 1 tablet (25 mg total) by mouth daily. 100 tablet 4  . magnesium oxide (MAG-OX) 400 MG tablet Take 400 mg by mouth daily.    . Melatonin 5 MG TABS Take by mouth.    . Potassium 99 MG TABS Take by mouth.    . ranitidine (ZANTAC) 150 MG tablet Take 150 mg by mouth daily.    . traZODone (DESYREL) 100 MG tablet 2 tabs daily at bedtime 200 tablet 11   No current facility-administered medications on file prior to visit.    BP 130/64 mmHg  Temp(Src) 98.4 F (36.9 C)  Wt 146 lb (66.225 kg)chart    Objective:   Physical Exam  Constitutional: She is oriented to person, place, and time. She appears well-developed and well-nourished.  Neck: Normal range of motion. Neck supple. No thyromegaly present.  Cardiovascular: Normal  rate, regular rhythm and normal heart sounds.   Pulmonary/Chest: Effort normal and breath sounds normal.  Abdominal: Soft. There is tenderness.  Left lower quadrant tenderness to deep palpation  Musculoskeletal: Normal range of motion.  Neurological: She is alert and oriented to person, place, and time.  Skin: Skin is warm and dry.  Psychiatric: She has a normal mood and affect.          Assessment & Plan:  Desiree Spencer was seen today for diverticulitis.  Diagnoses and all orders for this visit:  Diverticulitis of colon  Abdominal pain, left lower quadrant  Other orders -     levofloxacin (LEVAQUIN) 500 MG tablet; Take 1 tablet (500 mg total) by mouth daily.   Call the office  with any questions or concerns. High fiber diet. Return if you develop fever, increased pain, or blood in stool.

## 2014-12-14 NOTE — Telephone Encounter (Signed)
Pt said she has flair up of diverticulitis and is asking if a antibiotic can be called in    Pharmacy; [facility]

## 2014-12-14 NOTE — Patient Instructions (Signed)
Diverticulitis °Diverticulitis is inflammation or infection of small pouches in your colon that form when you have a condition called diverticulosis. The pouches in your colon are called diverticula. Your colon, or large intestine, is where water is absorbed and stool is formed. °Complications of diverticulitis can include: °· Bleeding. °· Severe infection. °· Severe pain. °· Perforation of your colon. °· Obstruction of your colon. °CAUSES  °Diverticulitis is caused by bacteria. °Diverticulitis happens when stool becomes trapped in diverticula. This allows bacteria to grow in the diverticula, which can lead to inflammation and infection. °RISK FACTORS °People with diverticulosis are at risk for diverticulitis. Eating a diet that does not include enough fiber from fruits and vegetables may make diverticulitis more likely to develop. °SYMPTOMS  °Symptoms of diverticulitis may include: °· Abdominal pain and tenderness. The pain is normally located on the left side of the abdomen, but may occur in other areas. °· Fever and chills. °· Bloating. °· Cramping. °· Nausea. °· Vomiting. °· Constipation. °· Diarrhea. °· Blood in your stool. °DIAGNOSIS  °Your health care provider will ask you about your medical history and do a physical exam. You may need to have tests done because many medical conditions can cause the same symptoms as diverticulitis. Tests may include: °· Blood tests. °· Urine tests. °· Imaging tests of the abdomen, including X-rays and CT scans. °When your condition is under control, your health care provider may recommend that you have a colonoscopy. A colonoscopy can show how severe your diverticula are and whether something else is causing your symptoms. °TREATMENT  °Most cases of diverticulitis are mild and can be treated at home. Treatment may include: °· Taking over-the-counter pain medicines. °· Following a clear liquid diet. °· Taking antibiotic medicines by mouth for 7-10 days. °More severe cases may  be treated at a hospital. Treatment may include: °· Not eating or drinking. °· Taking prescription pain medicine. °· Receiving antibiotic medicines through an IV tube. °· Receiving fluids and nutrition through an IV tube. °· Surgery. °HOME CARE INSTRUCTIONS  °· Follow your health care provider's instructions carefully. °· Follow a full liquid diet or other diet as directed by your health care provider. After your symptoms improve, your health care provider may tell you to change your diet. He or she may recommend you eat a high-fiber diet. Fruits and vegetables are good sources of fiber. Fiber makes it easier to pass stool. °· Take fiber supplements or probiotics as directed by your health care provider. °· Only take medicines as directed by your health care provider. °· Keep all your follow-up appointments. °SEEK MEDICAL CARE IF:  °· Your pain does not improve. °· You have a hard time eating food. °· Your bowel movements do not return to normal. °SEEK IMMEDIATE MEDICAL CARE IF:  °· Your pain becomes worse. °· Your symptoms do not get better. °· Your symptoms suddenly get worse. °· You have a fever. °· You have repeated vomiting. °· You have bloody or black, tarry stools. °MAKE SURE YOU:  °· Understand these instructions. °· Will watch your condition. °· Will get help right away if you are not doing well or get worse. °  °This information is not intended to replace advice given to you by your health care provider. Make sure you discuss any questions you have with your health care provider. °  °Document Released: 11/14/2004 Document Revised: 02/09/2013 Document Reviewed: 12/30/2012 °Elsevier Interactive Patient Education ©2016 Elsevier Inc[employer] ° °

## 2014-12-14 NOTE — Telephone Encounter (Signed)
Pt has made an appt to see webb today

## 2015-02-15 ENCOUNTER — Encounter: Payer: Self-pay | Admitting: Family Medicine

## 2015-02-15 ENCOUNTER — Ambulatory Visit (INDEPENDENT_AMBULATORY_CARE_PROVIDER_SITE_OTHER): Payer: Medicare Other | Admitting: Family Medicine

## 2015-02-15 VITALS — BP 131/79 | HR 103 | Temp 98.7°F | Resp 20 | Wt 145.0 lb

## 2015-02-15 DIAGNOSIS — Z23 Encounter for immunization: Secondary | ICD-10-CM | POA: Diagnosis not present

## 2015-02-15 DIAGNOSIS — R05 Cough: Secondary | ICD-10-CM

## 2015-02-15 DIAGNOSIS — L659 Nonscarring hair loss, unspecified: Secondary | ICD-10-CM | POA: Diagnosis not present

## 2015-02-15 DIAGNOSIS — J0100 Acute maxillary sinusitis, unspecified: Secondary | ICD-10-CM | POA: Diagnosis not present

## 2015-02-15 DIAGNOSIS — R059 Cough, unspecified: Secondary | ICD-10-CM

## 2015-02-15 DIAGNOSIS — Z411 Encounter for cosmetic surgery: Secondary | ICD-10-CM | POA: Diagnosis not present

## 2015-02-15 DIAGNOSIS — L821 Other seborrheic keratosis: Secondary | ICD-10-CM | POA: Diagnosis not present

## 2015-02-15 DIAGNOSIS — L723 Sebaceous cyst: Secondary | ICD-10-CM | POA: Diagnosis not present

## 2015-02-15 MED ORDER — HYDROCODONE-HOMATROPINE 5-1.5 MG/5ML PO SYRP: 5.0000 mL | ORAL_SOLUTION | Freq: Three times a day (TID) | ORAL | Status: DC | PRN

## 2015-02-15 MED ORDER — DOXYCYCLINE HYCLATE 100 MG PO TABS: 100.0000 mg | ORAL_TABLET | Freq: Two times a day (BID) | ORAL | Status: DC

## 2015-02-15 NOTE — Progress Notes (Signed)
   Subjective:    Patient ID: Desiree Spencer, female    DOB: 06-08-1940, 74 y.o.   MRN: [id number]  HPI  Sore throat: Patient presents for 2 week history of sore throat, runny nose, nasal congestion, sinus pressure, headache and cough. She states that she thought she was starting to feel better, but the next week and her symptoms have worsened. She denies any nausea, vomit, diarrhea or rash. She denies any fevers, chills or ear pressure. She reports she is eating and drinking well. She denies any shortness of breath or wheezing. She had a history of asthma as a child but hasn't had any problems since. She is mildly tachycardic, but in review this is her normal heart rate.   Never smoker  Past Medical History  Diagnosis Date  . HYPERLIPIDEMIA 07/01/2008  . HYPERTENSION 08/14/2006  . ALLERGIC RHINITIS 08/14/2006  . Extrinsic asthma, unspecified 03/30/2007  . GERD 08/14/2006  . DIVERTICULITIS, ACUTE 06/07/2008  . Acute cystitis 07/22/2007  . PILAR CYST 07/20/2008  . OSTEOARTHRITIS 08/14/2006  . [place] DISEASE, LUMBAR 08/07/2009  . ROTATOR CUFF INJURY, RIGHT SHOULDER 11/11/2008  . HEPATITIS B, HX OF 08/14/2006   No Known Allergies  Review of Systems Negative, with the exception of above mentioned in HPI      Objective:   Physical Exam BP 131/79 mmHg  Pulse 103  Temp(Src) 98.7 F (37.1 C)  Resp 20  Wt 145 lb (65.772 kg)  SpO2 96% Gen: Afebrile. No acute distress. Nontoxic in appearance, well-developed, well-nourished, Caucasian female. Very pleasant. HENT: AT. [place]. Bilateral TM visualized and normal in appearance. MMM, no oral lesions. Bilateral nares with erythema and swelling. Throat wit mild erythema, no exudates. Mild cough on exam. Hoarseness present on exam. Tenderness to palpation maxillary sinuses. Eyes:Pupils Equal Round Reactive to light, Extraocular movements intact,  Conjunctiva without redness, discharge or icterus. Neck/lymp/endocrine: Supple, bilateral shotty cervical  lymphadenopathy CV: Mildly Tachycardic, no murmur appreciated  Chest: CTAB, no wheeze or crackles. Good air movement, normal respiratory effort Abd: Soft. NTND. BS present Skin: No rashes, purpura or petechiae.     Assessment & Plan:  1. Cough - Patient was encouraged to use over-the-counter cough syrup during the day, and reserve the Hycodan for qhs. She was informed that there will be no refills on cough syrup. - HYDROcodone-homatropine (HYCODAN) 5-1.5 MG/5ML syrup; Take 5 mLs by mouth every 8 (eight) hours as needed for cough.  Dispense: 120 mL; Refill: 0  2. Acute maxillary sinusitis, recurrence not specified - Flonase, Mucinex, doxycycline, rest, hydrate, Tylenol/Advil. - doxycycline (VIBRA-TABS) 100 MG tablet; Take 1 tablet (100 mg total) by mouth 2 (two) times daily.  Dispense: 20 tablet; Refill: 0  Follow-up one week when necessary.

## 2015-02-15 NOTE — Patient Instructions (Signed)
Rest Hydrate, use cough syrup prescribed at night only and over the counter cough syrup though the day.  No refills on prescribed cough syrup Doxycyline called in for you to take twice a day for ten days.  Use flonase and mucinex as well.   Sinusitis, Adult Sinusitis is redness, soreness, and inflammation of the paranasal sinuses. Paranasal sinuses are air pockets within the bones of your face. They are located beneath your eyes, in the middle of your forehead, and above your eyes. In healthy paranasal sinuses, mucus is able to drain out, and air is able to circulate through them by way of your nose. However, when your paranasal sinuses are inflamed, mucus and air can become trapped. This can allow bacteria and other germs to grow and cause infection. Sinusitis can develop quickly and last only a short time (acute) or continue over a long period (chronic). Sinusitis that lasts for more than 12 weeks is considered chronic. CAUSES Causes of sinusitis include:  Allergies.  Structural abnormalities, such as displacement of the cartilage that separates your nostrils (deviated septum), which can decrease the air flow through your nose and sinuses and affect sinus drainage.  Functional abnormalities, such as when the small hairs (cilia) that line your sinuses and help remove mucus do not work properly or are not present. SIGNS AND SYMPTOMS Symptoms of acute and chronic sinusitis are the same. The primary symptoms are pain and pressure around the affected sinuses. Other symptoms include:  Upper toothache.  Earache.  Headache.  Bad breath.  Decreased sense of smell and taste.  A cough, which worsens when you are lying flat.  Fatigue.  Fever.  Thick drainage from your nose, which often is green and may contain pus (purulent).  Swelling and warmth over the affected sinuses. DIAGNOSIS Your health care provider will perform a physical exam. During your exam, your health care provider may  perform any of the following to help determine if you have acute sinusitis or chronic sinusitis:  Look in your nose for signs of abnormal growths in your nostrils (nasal polyps).  Tap over the affected sinus to check for signs of infection.  View the inside of your sinuses using an imaging device that has a light attached (endoscope). If your health care provider suspects that you have chronic sinusitis, one or more of the following tests may be recommended:  Allergy tests.  Nasal culture. A sample of mucus is taken from your nose, sent to a lab, and screened for bacteria.  Nasal cytology. A sample of mucus is taken from your nose and examined by your health care provider to determine if your sinusitis is related to an allergy. TREATMENT Most cases of acute sinusitis are related to a viral infection and will resolve on their own within 10 days. Sometimes, medicines are prescribed to help relieve symptoms of both acute and chronic sinusitis. These may include pain medicines, decongestants, nasal steroid sprays, or saline sprays. However, for sinusitis related to a bacterial infection, your health care provider will prescribe antibiotic medicines. These are medicines that will help kill the bacteria causing the infection. Rarely, sinusitis is caused by a fungal infection. In these cases, your health care provider will prescribe antifungal medicine. For some cases of chronic sinusitis, surgery is needed. Generally, these are cases in which sinusitis recurs more than 3 times per year, despite other treatments. HOME CARE INSTRUCTIONS  Drink plenty of water. Water helps thin the mucus so your sinuses can drain more easily.  Use  a humidifier.  Inhale steam 3-4 times a day (for example, sit in the bathroom with the shower running).  Apply a warm, moist washcloth to your face 3-4 times a day, or as directed by your health care provider.  Use saline nasal sprays to help moisten and clean your  sinuses.  Take medicines only as directed by your health care provider.  If you were prescribed either an antibiotic or antifungal medicine, finish it all even if you start to feel better. SEEK IMMEDIATE MEDICAL CARE IF:  You have increasing pain or severe headaches.  You have nausea, vomiting, or drowsiness.  You have swelling around your face.  You have vision problems.  You have a stiff neck.  You have difficulty breathing.   This information is not intended to replace advice given to you by your health care provider. Make sure you discuss any questions you have with your health care provider.   Document Released: 02/04/2005 Document Revised: 02/25/2014 Document Reviewed: 02/19/2011 Elsevier Interactive Patient Education [employer].

## 2015-02-16 ENCOUNTER — Encounter: Payer: Self-pay | Admitting: *Deleted

## 2015-02-28 ENCOUNTER — Ambulatory Visit (INDEPENDENT_AMBULATORY_CARE_PROVIDER_SITE_OTHER): Payer: Medicare Other | Admitting: Family Medicine

## 2015-02-28 ENCOUNTER — Encounter: Payer: Self-pay | Admitting: Family Medicine

## 2015-02-28 VITALS — BP 120/24 | Temp 98.5°F | Wt 143.0 lb

## 2015-02-28 DIAGNOSIS — B3749 Other urogenital candidiasis: Secondary | ICD-10-CM | POA: Insufficient documentation

## 2015-02-28 DIAGNOSIS — N390 Urinary tract infection, site not specified: Secondary | ICD-10-CM | POA: Insufficient documentation

## 2015-02-28 DIAGNOSIS — R300 Dysuria: Secondary | ICD-10-CM

## 2015-02-28 LAB — POCT URINALYSIS DIPSTICK
BILIRUBIN UA: NEGATIVE
GLUCOSE UA: NEGATIVE
KETONES UA: NEGATIVE
Nitrite, UA: NEGATIVE
PH UA: 7.5
Protein, UA: NEGATIVE
RBC UA: NEGATIVE
SPEC GRAV UA: 1.01
Urobilinogen, UA: 0.2

## 2015-02-28 MED ORDER — SULFAMETHOXAZOLE-TRIMETHOPRIM 800-160 MG PO TABS: 1.0000 | ORAL_TABLET | Freq: Two times a day (BID) | ORAL | Status: DC

## 2015-02-28 NOTE — Progress Notes (Signed)
   Subjective:    Patient ID: Desiree Spencer, female    DOB: [birth year], 75 y.o.   MRN: [id number]  HPI Raeesah is a 75 year old female nonsmoker who comes in today with a 5 hour history of urinary frequency and dysuria  She says she had an episode like this year ago turn out to be urinary tract infection.  She has no fever chills or back pain   Review of Systems    review of systems negative Objective:   Physical Exam Well-developed well-nourished female no acute distress vital signs stable she's afebrile urinalysis normal except for trace a white cells       Assessment & Plan:  Question urine tract infection........ send urine for culture........ start broad-spectrum antibiotics until culture report

## 2015-02-28 NOTE — Progress Notes (Signed)
Pre visit review using our clinic review tool, if applicable. No additional management support is needed unless otherwise documented below in the visit note. 

## 2015-02-28 NOTE — Patient Instructions (Signed)
Drink lots water  Cranberry juice,,,,,,,,, 4 ounces twice daily  Septra DS,,,,,,,,,, one twice daily,,,,,,,,,, we will call you the culture report as soon as he gets back

## 2015-03-01 ENCOUNTER — Encounter: Payer: Self-pay | Admitting: Gastroenterology

## 2015-03-02 LAB — URINE CULTURE
COLONY COUNT: NO GROWTH
Organism ID, Bacteria: NO GROWTH

## 2015-04-20 ENCOUNTER — Ambulatory Visit: Payer: Medicare Other | Admitting: Gastroenterology

## 2015-05-09 ENCOUNTER — Telehealth: Payer: Self-pay | Admitting: Family Medicine

## 2015-05-09 DIAGNOSIS — E785 Hyperlipidemia, unspecified: Secondary | ICD-10-CM

## 2015-05-09 DIAGNOSIS — I10 Essential (primary) hypertension: Secondary | ICD-10-CM

## 2015-05-09 NOTE — Telephone Encounter (Signed)
Pt is sch for cpx on 12-12-15 and lab work on 12/05/15. Apolonio Schneiders please put order in system for blood work

## 2015-05-10 NOTE — Telephone Encounter (Signed)
Labs ordered.

## 2015-07-19 ENCOUNTER — Encounter: Payer: Self-pay | Admitting: Adult Health

## 2015-07-19 ENCOUNTER — Ambulatory Visit (INDEPENDENT_AMBULATORY_CARE_PROVIDER_SITE_OTHER): Payer: Medicare Other | Admitting: Adult Health

## 2015-07-19 VITALS — BP 150/78 | Temp 97.8°F | Ht 64.5 in | Wt 149.9 lb

## 2015-07-19 DIAGNOSIS — L570 Actinic keratosis: Secondary | ICD-10-CM | POA: Diagnosis not present

## 2015-07-19 NOTE — Patient Instructions (Signed)
Your wound appears as Actinic keratosis.   At this point I am not concerned with this being cancerous.   Follow up if you do not notice a change in the next month

## 2015-07-19 NOTE — Progress Notes (Signed)
Subjective:    Patient ID: Desiree Spencer, female    DOB: 1940/02/23, 75 y.o.   MRN: [id number]  HPI  75 year old female, patient of Dr. Sherren Mocha who presents to the office today for a skin check. She reports that over the last month she has had a small dark spot the size of a pencil eraser pop up on her left upper arm.     Review of Systems  Skin: Positive for color change.  All other systems reviewed and are negative.  Past Medical History  Diagnosis Date  . HYPERLIPIDEMIA 07/01/2008  . HYPERTENSION 08/14/2006  . ALLERGIC RHINITIS 08/14/2006  . Extrinsic asthma, unspecified 03/30/2007  . GERD 08/14/2006  . DIVERTICULITIS, ACUTE 06/07/2008  . Acute cystitis 07/22/2007  . PILAR CYST 07/20/2008  . OSTEOARTHRITIS 08/14/2006  . [place] DISEASE, LUMBAR 08/07/2009  . ROTATOR CUFF INJURY, RIGHT SHOULDER 11/11/2008  . HEPATITIS B, HX OF 08/14/2006    Social History   Social History  . Marital Status: Married    Spouse Name: N/A  . Number of Children: N/A  . Years of Education: N/A   Occupational History  . Not on file.   Social History Main Topics  . Smoking status: Never Smoker   . Smokeless tobacco: Not on file  . Alcohol Use: Not on file  . Drug Use: Not on file  . Sexual Activity: Not on file   Other Topics Concern  . Not on file   Social History Narrative    Past Surgical History  Procedure Laterality Date  . Childbirth      X 2  . Cholecystectomy    . Abdominal hysterectomy      Family History  Problem Relation Age of Onset  . Hyperlipidemia Other   . Hypertension Other     No Known Allergies  Current Outpatient Prescriptions on File Prior to Visit  Medication Sig Dispense Refill  . aspirin 81 MG tablet Take 81 mg by mouth daily.      . Calcium-Magnesium-Vitamin D (CALCIUM 500 PO) Take by mouth.    . fish oil-omega-3 fatty acids 1000 MG capsule Take 1 g by mouth daily.      . fluticasone (FLONASE) 50 MCG/ACT nasal spray Place into both nostrils daily.    .  hydrochlorothiazide (HYDRODIURIL) 25 MG tablet Take 1 tablet (25 mg total) by mouth daily. 100 tablet 4  . magnesium oxide (MAG-OX) 400 MG tablet Take 400 mg by mouth daily.    . Melatonin 5 MG TABS Take by mouth.    . Potassium 99 MG TABS Take by mouth.    . ranitidine (ZANTAC) 150 MG tablet Take 150 mg by mouth daily.     No current facility-administered medications on file prior to visit.    BP 150/78 mmHg  Temp(Src) 97.8 F (36.6 C)  Ht 5' 4.5" (1.638 m)  Wt 149 lb 14.4 oz (67.994 kg)  BMI 25.34 kg/m2       Objective:   Physical Exam  Constitutional: She is oriented to person, place, and time. She appears well-developed and well-nourished. No distress.  Musculoskeletal: Normal range of motion.  Neurological: She is alert and oriented to person, place, and time.  Skin: Skin is warm and dry. No rash noted. She is not diaphoretic. No erythema. No pallor.  Small 0.3 cm brown symmetrical area on left upper arm. Appears as Actinic keratosis  Psychiatric: She has a normal mood and affect. Her behavior is normal. Judgment and  thought content normal.  Vitals reviewed.      Assessment & Plan:  1. Actinic keratosis - I offered to shave and biopsy area in question. Patient would like to wait.  - Does not appear as BCC, SCC, or melanoma.  - Return if no change in the next month  Dorothyann Peng, NP

## 2015-10-12 NOTE — Progress Notes (Signed)
Subjective:   Desiree Spencer is a 75 y.o. female who presents for Medicare Annual (Subsequent) preventive examination.  HRA assessment completed during this visit with [name];     Acute illness today Temp 98.6; developed cold symptoms and cough x 5 days ago on cruise ship coming in from [place]; Was told she had  [place] flu since Sunday; Had a temp but afebrile this am; Was exposed to [place] flu by MD on the ship. had z pack and finished last does Thursday night. Slightly productive cough; needs cough medicine. Apt made with Desiree Spencer to see today .   The Patient was informed that the wellness visit is to identify future health risk and educate and initiate measures that can reduce risk for increased disease through the lifespan.    NO ROS; Medicare Wellness Visit CPE scheduled with Dr. Sherren Spencer in October  PMH: OA;  HTN medically managed  Hyperlipidemia/ 2016 labs; cho 235; Trig 91; HDL 55; LDL 161  Reviewed the patient's labs; likes sweets   Psychosocial:  Mother had hd and was overweight; lived to 29 Dad died at 31 Lives with spouse  2 children; one in [place]; [place]   Tobacco neg EtOH Neg   Medications reviewed for issues; compliance; otc meds One rx, did not take on trip but is back on her medicine now.   BMI: 26.3   Diet: Typically drink protein shake  Eat out for lunch;  [place]; [employer]; (likes their biscuits but tries not to eat to often)  Eats a Light supper Eats chicken and fish / bakes or broils   Dental work: Regular dental exam   Exercise;   Cooler weather she walks;  Exercise Equipment; down stairs;  Moving the equipment where she can use it; but basement is musky; discussed walking in fresh air would be better;  Has a good place to walk near her home  [employer]od[employer] Kos has a walking program around the gym but starts to early; may consider as a buddy generally helps patient stick to the [place] term plan reviewed  Has a basement;  Home:  multi- level; barriers; or needs identified as bathroom railing or other review; 2 level's; plans to age in place  (has a retirement facility building next door) Discussed moving in there if needed.  Bedrooms and bathrooms downstairs;  Has separate shower   Fall hx; no   Given education on "Fall Prevention in the Home" for more safety tips the patient can apply as appropriate.   Personal safety issues reviewed:  1.  for risk such as safe community 2.  smoke detector 3.  firearms safety if applicable  4. protection when in the sun; yes 5. driving safety for seniors or any recent accidents. no   Risk for Depression reviewed: Any emotional problems? Anxious, depressed, irritable, sad or blue? no Denies feeling depressed or hopeless; voices pleasure in daily life How many social activities have you been engaged in within the last 2 weeks? no Who would help you with chores; illness; shopping other?   Cognitive;  Manages checkbook, medications; no failures of task Ad8 score reviewed for issues;  Issues making decisions; no  Less interest in hobbies / activities" no  Repeats questions, stories; family complaining: NO  Trouble using ordinary gadgets; microwave; computer: no  Forgets the month or year: no  Mismanaging finances: no  Missing apt: no but does write them down  Daily problems with thinking of memory NO Ad8 score  is 0  MMSE not appropriate unless AD8 score is > 2   Advanced Directive addressed;  Yes; will bring a copy for the chart   Counseling Health Maintenance Gaps: Dexa; can be repeated;  ABN noted at closure. Discussed with the patient and to call the insurance and ask them what her oop will be and if to much, can discuss repeat dexa with Dr. Sherren Spencer.   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   Tobacco History  Smoking Status  . Never Smoker  Smokeless Tobacco  . Not on file     Counseling given: Yes   Past Medical History:  Diagnosis Date  . Acute cystitis 07/22/2007  . ALLERGIC RHINITIS 08/14/2006  . [place] DISEASE, LUMBAR 08/07/2009  . DIVERTICULITIS, ACUTE 06/07/2008  . Extrinsic asthma, unspecified 03/30/2007  . GERD 08/14/2006  . HEPATITIS B, HX OF 08/14/2006  . HYPERLIPIDEMIA 07/01/2008  . HYPERTENSION 08/14/2006  . OSTEOARTHRITIS 08/14/2006  . PILAR CYST 07/20/2008  . ROTATOR CUFF INJURY, RIGHT SHOULDER 11/11/2008   Past Surgical History:  Procedure Laterality Date  . ABDOMINAL HYSTERECTOMY    . childbirth     X 2  . CHOLECYSTECTOMY     Family History  Problem Relation Age of Onset  . Hyperlipidemia Other   . Hypertension Other    History  Sexual Activity  . Sexual activity: Not on file    Outpatient Encounter Prescriptions as of 10/13/2015  Medication Sig  . aspirin 81 MG tablet Take 81 mg by mouth daily.    . Calcium-Magnesium-Vitamin D (CALCIUM 500 PO) Take by mouth.  . fish oil-omega-3 fatty acids 1000 MG capsule Take 1 g by mouth  daily.    . hydrochlorothiazide (HYDRODIURIL) 25 MG tablet Take 1 tablet (25 mg total) by mouth daily.  . magnesium oxide (MAG-OX) 400 MG tablet Take 400 mg by mouth daily.  . Melatonin 5 MG TABS Take by mouth.  . Potassium 99 MG TABS Take by mouth.  . ranitidine (ZANTAC) 150 MG tablet Take 150 mg by mouth daily.  . fluticasone (FLONASE) 50 MCG/ACT nasal spray Place into both nostrils daily.   No facility-administered encounter medications on file as of 10/13/2015.     Activities of Daily Living In your present state of health, do you have any difficulty performing the following activities: 10/13/2015  Hearing?  N  Vision? Y  Difficulty concentrating or making decisions? N  Walking or climbing stairs? N  Dressing or bathing? N  Doing errands, shopping? N  Preparing Food and eating ? N  Using the Toilet? N  In the past six months, have you accidently leaked urine? N  Do you have problems with loss of bowel control? N  Managing your Medications? N  Managing your Finances? N  Housekeeping or managing your Housekeeping? N  Some recent data might be hidden    Patient Care Team: Dorena Cookey, MD as PCP - General    Assessment:     Exercise Activities and Dietary recommendations Current Exercise Habits: Home exercise routine, Type of exercise: walking, Time (Minutes): 30, Frequency (Times/Week): 4, Weekly Exercise (Minutes/Week): 120, Intensity: Moderate  Goals    . Reduce sugar intake to X grams per day          Likes sugar  Tries different recipes; diabetic or paleo;  Be mindful       Fall Risk Fall Risk  10/13/2015 11/21/2014 09/27/2013  Falls in the past year? No No No   Depression Screen PHQ 2/9 Scores 10/13/2015 11/21/2014 09/27/2013  PHQ - 2 Score 0 0 0     Cognitive Testing MMSE - Mini Mental State Exam 10/13/2015  Not completed: (No Data)    Immunization History  Administered Date(s) Administered  . Influenza Split 10/19/2013  . Influenza Whole 02/18/2006,  09/27/2008  . Influenza, High Dose Seasonal PF 11/21/2014  . Pneumococcal Conjugate-13 09/27/2013  . Pneumococcal Polysaccharide-23 02/18/2006  . Td 06/10/2006  . Zoster 08/31/2007   Screening Tests Health Maintenance  Topic Date Due  . DEXA SCAN  07/22/2005  . INFLUENZA VACCINE  09/19/2015  . MAMMOGRAM  12/19/2015 (Originally 10/11/2015)  . TETANUS/TDAP  06/09/2016  . COLONOSCOPY  08/06/2018  . ZOSTAVAX  Completed  . PNA vac Low Risk Adult  Completed      Plan:   Try to walk x 30 min 4 days a week   Call insurance company about the Dexa scan out of pocket;  Ordered but can defer if you would like to talk Dr. Sherren Spencer   Given information on the DASH diet  Will postpone flu shot due to acute illness today.  During the course of the visit the patient was educated and counseled about the following appropriate screening and preventive services:   Vaccines to include Pneumoccal, Influenza, Hepatitis B, Td, Zostavax, HCV/  Electrocardiogram deferred   Cardiovascular Disease/ deferred   Colorectal cancer screening- due 2020   Bone density screening- educated   Diabetes screening/ neg  Glaucoma screening/ eye exam annual   Mammography/ planned for 8/28   Nutrition counseling / reducing sugar and sodium discussed   Patient Instructions (the written plan) was given to the patient.   Wynetta Fines, RN  10/13/2015

## 2015-10-13 ENCOUNTER — Ambulatory Visit (INDEPENDENT_AMBULATORY_CARE_PROVIDER_SITE_OTHER): Payer: Medicare Other

## 2015-10-13 ENCOUNTER — Ambulatory Visit (INDEPENDENT_AMBULATORY_CARE_PROVIDER_SITE_OTHER): Payer: Medicare Other | Admitting: Adult Health

## 2015-10-13 VITALS — BP 130/80 | HR 87 | Temp 98.6°F | Resp 98 | Ht 65.0 in | Wt 158.0 lb

## 2015-10-13 DIAGNOSIS — M8580 Other specified disorders of bone density and structure, unspecified site: Secondary | ICD-10-CM

## 2015-10-13 DIAGNOSIS — J069 Acute upper respiratory infection, unspecified: Secondary | ICD-10-CM

## 2015-10-13 DIAGNOSIS — Z0000 Encounter for general adult medical examination without abnormal findings: Secondary | ICD-10-CM | POA: Diagnosis not present

## 2015-10-13 DIAGNOSIS — Z0289 Encounter for other administrative examinations: Secondary | ICD-10-CM

## 2015-10-13 MED ORDER — HYDROCODONE-HOMATROPINE 5-1.5 MG/5ML PO SYRP: 5.0000 mL | ORAL_SOLUTION | Freq: Three times a day (TID) | ORAL | 0 refills | Status: DC | PRN

## 2015-10-13 NOTE — Progress Notes (Signed)
I have reviewed and agree with this plan  

## 2015-10-13 NOTE — Patient Instructions (Addendum)
Desiree Spencer , Thank you for taking time to come for your Medicare Wellness Visit. I appreciate your ongoing commitment to your health goals. Please review the following plan we discussed and let me know if I can assist you in the future.   Try to walk x 30 min 4 days a week   Call insurance company about the Dexa scan out of pocket;  Ordered but can defer if you would like to talk Dr. Sherren Mocha   These are the goals we discussed: Goals    . Reduce sugar intake to X grams per day          Likes sugar  Tries different recipes; diabetic or paleo;  Be mindful        This is a list of the screening recommended for you and due dates:  Health Maintenance  Topic Date Due  . DEXA scan (bone density measurement)  07/22/2005  . Flu Shot  09/19/2015  . Mammogram  12/19/2015*  . Tetanus Vaccine  06/09/2016  . Colon Cancer Screening  08/06/2018  . Shingles Vaccine  Completed  . Pneumonia vaccines  Completed  *Topic was postponed. The date shown is not the original due date.   DASH Eating Plan DASH stands for "Dietary Approaches to Stop Hypertension." The DASH eating plan is a healthy eating plan that has been shown to reduce high blood pressure (hypertension). Additional health benefits may include reducing the risk of type 2 diabetes mellitus, heart disease, and stroke. The DASH eating plan may also help with weight loss. WHAT DO I NEED TO KNOW ABOUT THE DASH EATING PLAN? For the DASH eating plan, you will follow these general guidelines:  Choose foods with a percent daily value for sodium of less than 5% (as listed on the food label).  Use salt-free seasonings or herbs instead of table salt or sea salt.  Check with your health care provider or pharmacist before using salt substitutes.  Eat lower-sodium products, often labeled as "lower sodium" or "no salt added."  Eat fresh foods.  Eat more vegetables, fruits, and low-fat dairy products.  Choose whole grains. Look for the word  "whole" as the first word in the ingredient list.  Choose fish and skinless chicken or [place] more often than red meat. Limit fish, poultry, and meat to 6 oz (170 g) each day.  Limit sweets, desserts, sugars, and sugary drinks.  Choose heart-healthy fats.  Limit cheese to 1 oz (28 g) per day.  Eat more home-cooked food and less restaurant, buffet, and fast food.  Limit fried foods.  Cook foods using methods other than frying.  Limit canned vegetables. If you do use them, rinse them well to decrease the sodium.  When eating at a restaurant, ask that your food be prepared with less salt, or no salt if possible. WHAT FOODS CAN I EAT? Seek help from a dietitian for individual calorie needs. Grains Whole grain or whole wheat bread. Brown rice. Whole grain or whole wheat pasta. Quinoa, bulgur, and whole grain cereals. Low-sodium cereals. Corn or whole wheat flour tortillas. Whole grain cornbread. Whole grain crackers. Low-sodium crackers. Vegetables Fresh or frozen vegetables (raw, steamed, roasted, or grilled). Low-sodium or reduced-sodium tomato and vegetable juices. Low-sodium or reduced-sodium tomato sauce and paste. Low-sodium or reduced-sodium canned vegetables.  Fruits All fresh, canned (in natural juice), or frozen fruits. Meat and Other Protein Products Ground beef (85% or leaner), grass-fed beef, or beef trimmed of fat. Skinless chicken or [place]. Ground chicken or  [place]. Pork trimmed of fat. All fish and seafood. Eggs. Dried beans, peas, or lentils. Unsalted nuts and seeds. Unsalted canned beans. Dairy Low-fat dairy products, such as skim or 1% milk, 2% or reduced-fat cheeses, low-fat ricotta or cottage cheese, or plain low-fat yogurt. Low-sodium or reduced-sodium cheeses. Fats and Oils Tub margarines without trans fats. Light or reduced-fat mayonnaise and salad dressings (reduced sodium). Avocado. Safflower, olive, or canola oils. Natural peanut or almond  butter. Other Unsalted popcorn and pretzels. The items listed above may not be a complete list of recommended foods or beverages. Contact your dietitian for more options. WHAT FOODS ARE NOT RECOMMENDED? Grains White bread. White pasta. White rice. Refined cornbread. Bagels and croissants. Crackers that contain trans fat. Vegetables Creamed or fried vegetables. Vegetables in a cheese sauce. Regular canned vegetables. Regular canned tomato sauce and paste. Regular tomato and vegetable juices. Fruits Dried fruits. Canned fruit in light or heavy syrup. Fruit juice. Meat and Other Protein Products Fatty cuts of meat. Ribs, chicken wings, bacon, sausage, bologna, salami, chitterlings, fatback, hot dogs, bratwurst, and packaged luncheon meats. Salted nuts and seeds. Canned beans with salt. Dairy Whole or 2% milk, cream, half-and-half, and cream cheese. Whole-fat or sweetened yogurt. Full-fat cheeses or blue cheese. Nondairy creamers and whipped toppings. Processed cheese, cheese spreads, or cheese curds. Condiments Onion and garlic salt, seasoned salt, table salt, and sea salt. Canned and packaged gravies. Worcestershire sauce. Tartar sauce. Barbecue sauce. Teriyaki sauce. Soy sauce, including reduced sodium. Steak sauce. Fish sauce. Oyster sauce. Cocktail sauce. Horseradish. Ketchup and mustard. Meat flavorings and tenderizers. Bouillon cubes. Hot sauce. Tabasco sauce. Marinades. Taco seasonings. Relishes. Fats and Oils Butter, stick margarine, lard, shortening, ghee, and bacon fat. Coconut, palm kernel, or palm oils. Regular salad dressings. Other Pickles and olives. Salted popcorn and pretzels. The items listed above may not be a complete list of foods and beverages to avoid. Contact your dietitian for more information. WHERE CAN I FIND MORE INFORMATION? National Heart, Lung, and Blood Institute: travelstabloid.com   This information is not intended to replace  advice given to you by your health care provider. Make sure you discuss any questions you have with your health care provider.   Document Released: 01/24/2011 Document Revised: 02/25/2014 Document Reviewed: 12/09/2012 Elsevier Interactive Patient Education 2016 [place].    Bone Densitometry Bone densitometry is an imaging test that uses a special X-ray to measure the amount of calcium and other minerals in your bones (bone density). This test is also known as a bone mineral density test or dual-energy X-ray absorptiometry (DXA). The test can measure bone density at your hip and your spine. It is similar to having a regular X-ray. You may have this test to:  Diagnose a condition that causes weak or thin bones (osteoporosis).  Predict your risk of a broken bone (fracture).  Determine how well osteoporosis treatment is working. LET [facility] CARE PROVIDER KNOW ABOUT:  Any allergies you have.  All medicines you are taking, including vitamins, herbs, eye drops, creams, and over-the-counter medicines.  Previous problems you or members of your family have had with the use of anesthetics.  Any blood disorders you have.  Previous surgeries you have had.  Medical conditions you have.  Possibility of pregnancy.  Any other medical test you had within the previous 14 days that used contrast material. RISKS AND COMPLICATIONS Generally, this is a safe procedure. However, problems can occur and may include the following:  This test exposes you to a  very small amount of radiation.  The risks of radiation exposure may be greater to unborn children. BEFORE THE PROCEDURE  Do not take any calcium supplements for 24 hours before having the test. You can otherwise eat and drink what you usually do.  Take off all metal jewelry, eyeglasses, dental appliances, and any other metal objects. PROCEDURE  You may lie on an exam table. There will be an X-ray generator below you and an imaging  device above you.  Other devices, such as boxes or braces, may be used to position your body properly for the scan.  You will need to lie still while the machine slowly scans your body.  The images will show up on a computer monitor. AFTER THE PROCEDURE You may need more testing at a later time.   This information is not intended to replace advice given to you by your health care provider. Make sure you discuss any questions you have with your health care provider.   Document Released: 02/27/2004 Document Revised: 02/25/2014 Document Reviewed: 07/15/2013 Elsevier Interactive Patient Education 2016 [place] in [place]  Falls can cause injuries. They can happen to people of all ages. There are many things you can do to make your home safe and to help prevent falls.  WHAT CAN I DO ON THE OUTSIDE OF MY HOME?  Regularly fix the edges of walkways and driveways and fix any cracks.  Remove anything that might make you trip as you walk through a door, such as a raised step or threshold.  Trim any bushes or trees on the path to your home.  Use bright outdoor lighting.  Clear any walking paths of anything that might make someone trip, such as rocks or tools.  Regularly check to see if handrails are loose or broken. Make sure that both sides of any steps have handrails.  Any raised decks and porches should have guardrails on the edges.  Have any leaves, snow, or ice cleared regularly.  Use sand or salt on walking paths during winter.  Clean up any spills in your garage right away. This includes oil or grease spills. WHAT CAN I DO IN THE BATHROOM?   Use night lights.  Install grab bars by the toilet and in the tub and shower. Do not use towel bars as grab bars.  Use non-skid mats or decals in the tub or shower.  If you need to sit down in the shower, use a plastic, non-slip stool.  Keep the floor dry. Clean up any water that spills on the floor as soon as  it happens.  Remove soap buildup in the tub or shower regularly.  Attach bath mats securely with double-sided non-slip rug tape.  Do not have throw rugs and other things on the floor that can make you trip. WHAT CAN I DO IN THE BEDROOM?  Use night lights.  Make sure that you have a light by your bed that is easy to reach.  Do not use any sheets or blankets that are too big for your bed. They should not hang down onto the floor.  Have a firm chair that has side arms. You can use this for support while you get dressed.  Do not have throw rugs and other things on the floor that can make you trip. WHAT CAN I DO IN THE KITCHEN?  Clean up any spills right away.  Avoid walking on wet floors.  Keep items that you use a lot  in easy-to-reach places.  If you need to reach something above you, use a strong step stool that has a grab bar.  Keep electrical cords out of the way.  Do not use floor polish or wax that makes floors slippery. If you must use wax, use non-skid floor wax.  Do not have throw rugs and other things on the floor that can make you trip. WHAT CAN I DO WITH MY STAIRS?  Do not leave any items on the stairs.  Make sure that there are handrails on both sides of the stairs and use them. Fix handrails that are broken or loose. Make sure that handrails are as long as the stairways.  Check any carpeting to make sure that it is firmly attached to the stairs. Fix any carpet that is loose or worn.  Avoid having throw rugs at the top or bottom of the stairs. If you do have throw rugs, attach them to the floor with carpet tape.  Make sure that you have a light switch at the top of the stairs and the bottom of the stairs. If you do not have them, ask someone to add them for you. WHAT ELSE CAN I DO TO HELP PREVENT FALLS?  Wear shoes that:  Do not have high heels.  Have rubber bottoms.  Are comfortable and fit you well.  Are closed at the toe. Do not wear sandals.  If  you use a stepladder:  Make sure that it is fully opened. Do not climb a closed stepladder.  Make sure that both sides of the stepladder are locked into place.  Ask someone to hold it for you, if possible.  Clearly mark and make sure that you can see:  Any grab bars or handrails.  First and last steps.  Where the edge of each step is.  Use tools that help you move around (mobility aids) if they are needed. These include:  Canes.  Walkers.  Scooters.  Crutches.  Turn on the lights when you go into a dark area. Replace any light bulbs as soon as they burn out.  Set up your furniture so you have a clear path. Avoid moving your furniture around.  If any of your floors are uneven, fix them.  If there are any pets around you, be aware of where they are.  Review your medicines with your doctor. Some medicines can make you feel dizzy. This can increase your chance of falling. Ask your doctor what other things that you can do to help prevent falls.   This information is not intended to replace advice given to you by your health care provider. Make sure you discuss any questions you have with your health care provider.   Document Released: 12/01/2008 Document Revised: 06/21/2014 Document Reviewed: 03/11/2014 Elsevier Interactive Patient Education 2016 [place] Maintenance, Female Adopting a healthy lifestyle and getting preventive care can go a long way to promote health and wellness. Talk with your health care provider about what schedule of regular examinations is right for you. This is a good chance for you to check in with your provider about disease prevention and staying healthy. In between checkups, there are plenty of things you can do on your own. Experts have done a lot of research about which lifestyle changes and preventive measures are most likely to keep you healthy. Ask your health care provider for more information. WEIGHT AND DIET  Eat a healthy  diet  Be sure to include plenty of  vegetables, fruits, low-fat dairy products, and lean protein.  Do not eat a lot of foods high in solid fats, added sugars, or salt.  Get regular exercise. This is one of the most important things you can do for your health.  Most adults should exercise for at least 150 minutes each week. The exercise should increase your heart rate and make you sweat (moderate-intensity exercise).  Most adults should also do strengthening exercises at least twice a week. This is in addition to the moderate-intensity exercise.  Maintain a healthy weight  Body mass index (BMI) is a measurement that can be used to identify possible weight problems. It estimates body fat based on height and weight. Your health care provider can help determine your BMI and help you achieve or maintain a healthy weight.  For females 99 years of age and older:   A BMI below 18.5 is considered underweight.  A BMI of 18.5 to 24.9 is normal.  A BMI of 25 to 29.9 is considered overweight.  A BMI of 30 and above is considered obese.  Watch levels of cholesterol and blood lipids  You should start having your blood tested for lipids and cholesterol at 75 years of age, then have this test every 5 years.  You may need to have your cholesterol levels checked more often if:  Your lipid or cholesterol levels are high.  You are older than 75 years of age.  You are at high risk for heart disease.  CANCER SCREENING   Lung Cancer  Lung cancer screening is recommended for adults 68-54 years old who are at high risk for lung cancer because of a history of smoking.  A yearly low-dose CT scan of the lungs is recommended for people who:  Currently smoke.  Have quit within the past 15 years.  Have at least a 30-pack-year history of smoking. A pack year is smoking an average of one pack of cigarettes a day for 1 year.  Yearly screening should continue until it has been 15 years since you  quit.  Yearly screening should stop if you develop a health problem that would prevent you from having lung cancer treatment.  Breast Cancer  Practice breast self-awareness. This means understanding how your breasts normally appear and feel.  It also means doing regular breast self-exams. Let your health care provider know about any changes, no matter how small.  If you are in your 20s or 30s, you should have a clinical breast exam (CBE) by a health care provider every 1-3 years as part of a regular health exam.  If you are 80 or older, have a CBE every year. Also consider having a breast X-ray (mammogram) every year.  If you have a family history of breast cancer, talk to your health care provider about genetic screening.  If you are at high risk for breast cancer, talk to your health care provider about having an MRI and a mammogram every year.  Breast cancer gene (BRCA) assessment is recommended for women who have family members with BRCA-related cancers. BRCA-related cancers include:  Breast.  Ovarian.  Tubal.  Peritoneal cancers.  Results of the assessment will determine the need for genetic counseling and BRCA1 and BRCA2 testing. Cervical Cancer Your health care provider may recommend that you be screened regularly for cancer of the pelvic organs (ovaries, uterus, and vagina). This screening involves a pelvic examination, including checking for microscopic changes to the surface of your cervix (Pap test). You may be encouraged  to have this screening done every 3 years, beginning at age 39.  For women ages 77-65, health care providers may recommend pelvic exams and Pap testing every 3 years, or they may recommend the Pap and pelvic exam, combined with testing for human papilloma virus (HPV), every 5 years. Some types of HPV increase your risk of cervical cancer. Testing for HPV may also be done on women of any age with unclear Pap test results.  Other health care providers may  not recommend any screening for nonpregnant women who are considered low risk for pelvic cancer and who do not have symptoms. Ask your health care provider if a screening pelvic exam is right for you.  If you have had past treatment for cervical cancer or a condition that could lead to cancer, you need Pap tests and screening for cancer for at least 20 years after your treatment. If Pap tests have been discontinued, your risk factors (such as having a new sexual partner) need to be reassessed to determine if screening should resume. Some women have medical problems that increase the chance of getting cervical cancer. In these cases, your health care provider may recommend more frequent screening and Pap tests. Colorectal Cancer  This type of cancer can be detected and often prevented.  Routine colorectal cancer screening usually begins at 75 years of age and continues through 75 years of age.  Your health care provider may recommend screening at an earlier age if you have risk factors for colon cancer.  Your health care provider may also recommend using home test kits to check for hidden blood in the stool.  A small camera at the end of a tube can be used to examine your colon directly (sigmoidoscopy or colonoscopy). This is done to check for the earliest forms of colorectal cancer.  Routine screening usually begins at age 44.  Direct examination of the colon should be repeated every 5-10 years through 75 years of age. However, you may need to be screened more often if early forms of precancerous polyps or small growths are found. Skin Cancer  Check your skin from head to toe regularly.  Tell your health care provider about any new moles or changes in moles, especially if there is a change in a mole's shape or color.  Also tell your health care provider if you have a mole that is larger than the size of a pencil eraser.  Always use sunscreen. Apply sunscreen liberally and repeatedly  throughout the day.  Protect yourself by wearing long sleeves, pants, a wide-brimmed hat, and sunglasses whenever you are outside. HEART DISEASE, DIABETES, AND HIGH BLOOD PRESSURE   High blood pressure causes heart disease and increases the risk of stroke. High blood pressure is more likely to develop in:  People who have blood pressure in the high end of the normal range (130-139/85-89 mm Hg).  People who are overweight or obese.  People who are African American.  If you are 58-47 years of age, have your blood pressure checked every 3-5 years. If you are 66 years of age or older, have your blood pressure checked every year. You should have your blood pressure measured twice--once when you are at a hospital or clinic, and once when you are not at a hospital or clinic. Record the average of the two measurements. To check your blood pressure when you are not at a hospital or clinic, you can use:  An automated blood pressure machine at a pharmacy.  A  home blood pressure monitor.  If you are between 61 years and 75 years old, ask your health care provider if you should take aspirin to prevent strokes.  Have regular diabetes screenings. This involves taking a blood sample to check your fasting blood sugar level.  If you are at a normal weight and have a low risk for diabetes, have this test once every three years after 75 years of age.  If you are overweight and have a high risk for diabetes, consider being tested at a younger age or more often. PREVENTING INFECTION  Hepatitis B  If you have a higher risk for hepatitis B, you should be screened for this virus. You are considered at high risk for hepatitis B if:  You were born in a country where hepatitis B is common. Ask your health care provider which countries are considered high risk.  Your parents were born in a high-risk country, and you have not been immunized against hepatitis B (hepatitis B vaccine).  You have HIV or  AIDS.  You use needles to inject street drugs.  You live with someone who has hepatitis B.  You have had sex with someone who has hepatitis B.  You get hemodialysis treatment.  You take certain medicines for conditions, including cancer, organ transplantation, and autoimmune conditions. Hepatitis C  Blood testing is recommended for:  Everyone born from [birth year] through 1965.  Anyone with known risk factors for hepatitis C. Sexually transmitted infections (STIs)  You should be screened for sexually transmitted infections (STIs) including gonorrhea and chlamydia if:  You are sexually active and are younger than 75 years of age.  You are older than 75 years of age and your health care provider tells you that you are at risk for this type of infection.  Your sexual activity has changed since you were last screened and you are at an increased risk for chlamydia or gonorrhea. Ask your health care provider if you are at risk.  If you do not have HIV, but are at risk, it may be recommended that you take a prescription medicine daily to prevent HIV infection. This is called pre-exposure prophylaxis (PrEP). You are considered at risk if:  You are sexually active and do not regularly use condoms or know the HIV status of your partner(s).  You take drugs by injection.  You are sexually active with a partner who has HIV. Talk with your health care provider about whether you are at high risk of being infected with HIV. If you choose to begin PrEP, you should first be tested for HIV. You should then be tested every 3 months for as long as you are taking PrEP.  PREGNANCY   If you are premenopausal and you may become pregnant, ask your health care provider about preconception counseling.  If you may become pregnant, take 400 to 800 micrograms (mcg) of folic acid every day.  If you want to prevent pregnancy, talk to your health care provider about birth control (contraception). OSTEOPOROSIS AND  MENOPAUSE   Osteoporosis is a disease in which the bones lose minerals and strength with aging. This can result in serious bone fractures. Your risk for osteoporosis can be identified using a bone density scan.  If you are 74 years of age or older, or if you are at risk for osteoporosis and fractures, ask your health care provider if you should be screened.  Ask your health care provider whether you should take a calcium or vitamin D supplement  to lower your risk for osteoporosis.  Menopause may have certain physical symptoms and risks.  Hormone replacement therapy may reduce some of these symptoms and risks. Talk to your health care provider about whether hormone replacement therapy is right for you.  HOME CARE INSTRUCTIONS   Schedule regular health, dental, and eye exams.  Stay current with your immunizations.   Do not use any tobacco products including cigarettes, chewing tobacco, or electronic cigarettes.  If you are pregnant, do not drink alcohol.  If you are breastfeeding, limit how much and how often you drink alcohol.  Limit alcohol intake to no more than 1 drink per day for nonpregnant women. One drink equals 12 ounces of beer, 5 ounces of wine, or 1 ounces of hard liquor.  Do not use street drugs.  Do not share needles.  Ask your health care provider for help if you need support or information about quitting drugs.  Tell your health care provider if you often feel depressed.  Tell your health care provider if you have ever been abused or do not feel safe at home.   This information is not intended to replace advice given to you by your health care provider. Make sure you discuss any questions you have with your health care provider.   Document Released: 08/20/2010 Document Revised: 02/25/2014 Document Reviewed: 01/06/2013 Elsevier Interactive Patient Education [employer].

## 2015-10-13 NOTE — Progress Notes (Signed)
Subjective:    Patient ID: Desiree Spencer, female    DOB: [birth year], 75 y.o.   MRN: [id number]  HPI  75 year old female who presents to the office today for flu- like symptoms.She was having her AWV when she mentioned that she was exposed to the flu while on a cruise.    Desiree Spencer reports that she was on a cruise in [place] and had returned last night. Over the last four days her symptoms included fever up to 100.8, body aches and cough. She was given a Z-pak by the medical center on the cruise ship and was told that she had been possibly exposed to the " [place] flu". She also reports that of the 45 persons on the cruise, 8 of them came down with the same symptoms.   She finished the Z pack yesterday. Per patient, she has started to feel better. Is using Motrin/Tylenol for symptom relief.    Review of Systems  Constitutional: Positive for chills (resolved), fatigue and fever (resolved). Negative for activity change and appetite change.  HENT: Negative.   Respiratory: Positive for cough. Negative for chest tightness, shortness of breath and wheezing.   Cardiovascular: Negative.   Gastrointestinal: Negative.   Musculoskeletal: Positive for myalgias. Negative for neck pain and neck stiffness.  Skin: Negative.   Neurological: Negative.   All other systems reviewed and are negative.  Past Medical History:  Diagnosis Date  . Acute cystitis 07/22/2007  . ALLERGIC RHINITIS 08/14/2006  . [place] DISEASE, LUMBAR 08/07/2009  . DIVERTICULITIS, ACUTE 06/07/2008  . Extrinsic asthma, unspecified 03/30/2007  . GERD 08/14/2006  . HEPATITIS B, HX OF 08/14/2006  . HYPERLIPIDEMIA 07/01/2008  . HYPERTENSION 08/14/2006  . OSTEOARTHRITIS 08/14/2006  . PILAR CYST 07/20/2008  . ROTATOR CUFF INJURY, RIGHT SHOULDER 11/11/2008    Social History   Social History  . Marital status: Married    Spouse name: N/A  . Number of children: N/A  . Years of education: N/A   Occupational History  . Not on file.   Social  History Main Topics  . Smoking status: Never Smoker  . Smokeless tobacco: Not on file  . Alcohol use Not on file  . Drug use: Unknown  . Sexual activity: Not on file   Other Topics Concern  . Not on file   Social History Narrative  . No narrative on file    Past Surgical History:  Procedure Laterality Date  . ABDOMINAL HYSTERECTOMY    . childbirth     X 2  . CHOLECYSTECTOMY      Family History  Problem Relation Age of Onset  . Hyperlipidemia Other   . Hypertension Other     No Known Allergies  Current Outpatient Prescriptions on File Prior to Visit  Medication Sig Dispense Refill  . aspirin 81 MG tablet Take 81 mg by mouth daily.      . Calcium-Magnesium-Vitamin D (CALCIUM 500 PO) Take by mouth.    . fish oil-omega-3 fatty acids 1000 MG capsule Take 1 g by mouth daily.      . fluticasone (FLONASE) 50 MCG/ACT nasal spray Place into both nostrils daily.    . hydrochlorothiazide (HYDRODIURIL) 25 MG tablet Take 1 tablet (25 mg total) by mouth daily. 100 tablet 4  . magnesium oxide (MAG-OX) 400 MG tablet Take 400 mg by mouth daily.    . Melatonin 5 MG TABS Take by mouth.    . Potassium 99 MG TABS Take by mouth.    Marland Kitchen  ranitidine (ZANTAC) 150 MG tablet Take 150 mg by mouth daily.     No current facility-administered medications on file prior to visit.     There were no vitals taken for this visit.      Objective:   Physical Exam  Constitutional: She is oriented to person, place, and time. She appears well-developed and well-nourished. No distress.  HENT:  Head: Normocephalic and atraumatic.  Right Ear: External ear normal.  Left Ear: External ear normal.  Nose: Nose normal.  Mouth/Throat: Oropharynx is clear and moist. No oropharyngeal exudate.  Eyes: Conjunctivae and EOM are normal. Pupils are equal, round, and reactive to light. Right eye exhibits no discharge. Left eye exhibits no discharge.  Neck: Normal range of motion. Neck supple. No JVD present. No tracheal  deviation present. No thyromegaly present.  Cardiovascular: Normal rate, regular rhythm, normal heart sounds and intact distal pulses.  Exam reveals no gallop and no friction rub.   No murmur heard. Pulmonary/Chest: Effort normal and breath sounds normal. No stridor. No respiratory distress. She has no wheezes. She has no rales.  Lymphadenopathy:    She has no cervical adenopathy.  Neurological: She is alert and oriented to person, place, and time. She has normal reflexes. She displays normal reflexes. No cranial nerve deficit. She exhibits normal muscle tone. Coordination normal.  Skin: Skin is warm and dry. No rash noted. She is not diaphoretic. No erythema. No pallor.  Psychiatric: She has a normal mood and affect. Her behavior is normal. Judgment and thought content normal.  Nursing note and vitals reviewed.     Assessment & Plan:  1. Acute upper respiratory infection - She did not want a flu swab today - Doubt flu but she is past the window for Tamiflu - HYDROcodone-homatropine (HYCODAN) 5-1.5 MG/5ML syrup; Take 5 mLs by mouth every 8 (eight) hours as needed for cough.  Dispense: 120 mL; Refill: 0 - Continue with tylenol/motrin for symptom relief.  - Follow up if symptoms become worse  Dorothyann Peng, NP

## 2015-10-16 DIAGNOSIS — Z1231 Encounter for screening mammogram for malignant neoplasm of breast: Secondary | ICD-10-CM | POA: Diagnosis not present

## 2015-10-16 DIAGNOSIS — M8589 Other specified disorders of bone density and structure, multiple sites: Secondary | ICD-10-CM | POA: Diagnosis not present

## 2015-10-16 DIAGNOSIS — Z780 Asymptomatic menopausal state: Secondary | ICD-10-CM | POA: Diagnosis not present

## 2015-10-16 LAB — HM DEXA SCAN

## 2015-10-16 LAB — HM MAMMOGRAPHY

## 2015-10-26 ENCOUNTER — Encounter: Payer: Self-pay | Admitting: Family Medicine

## 2015-11-09 ENCOUNTER — Ambulatory Visit (INDEPENDENT_AMBULATORY_CARE_PROVIDER_SITE_OTHER): Payer: Medicare Other

## 2015-11-09 DIAGNOSIS — Z23 Encounter for immunization: Secondary | ICD-10-CM

## 2015-11-16 ENCOUNTER — Ambulatory Visit (INDEPENDENT_AMBULATORY_CARE_PROVIDER_SITE_OTHER)
Admission: RE | Admit: 2015-11-16 | Discharge: 2015-11-16 | Disposition: A | Discharge status: Home / Self Care | Payer: Medicare Other | Source: Ambulatory Visit | Attending: Family Medicine | Admitting: Family Medicine

## 2015-11-16 ENCOUNTER — Ambulatory Visit (INDEPENDENT_AMBULATORY_CARE_PROVIDER_SITE_OTHER): Payer: Medicare Other | Admitting: Family Medicine

## 2015-11-16 ENCOUNTER — Encounter: Payer: Self-pay | Admitting: Family Medicine

## 2015-11-16 ENCOUNTER — Telehealth: Payer: Self-pay | Admitting: Family Medicine

## 2015-11-16 VITALS — BP 148/78 | HR 87 | Temp 97.8°F | Wt 150.2 lb

## 2015-11-16 DIAGNOSIS — R079 Chest pain, unspecified: Secondary | ICD-10-CM | POA: Diagnosis not present

## 2015-11-16 IMAGING — DX DG CHEST 2V
2 series · 2 of 2 positions shown · non-contrast
Comparison: None.

CLINICAL DATA: Left-sided chest pain

EXAM:
CHEST  2 VIEW

[chest pa]
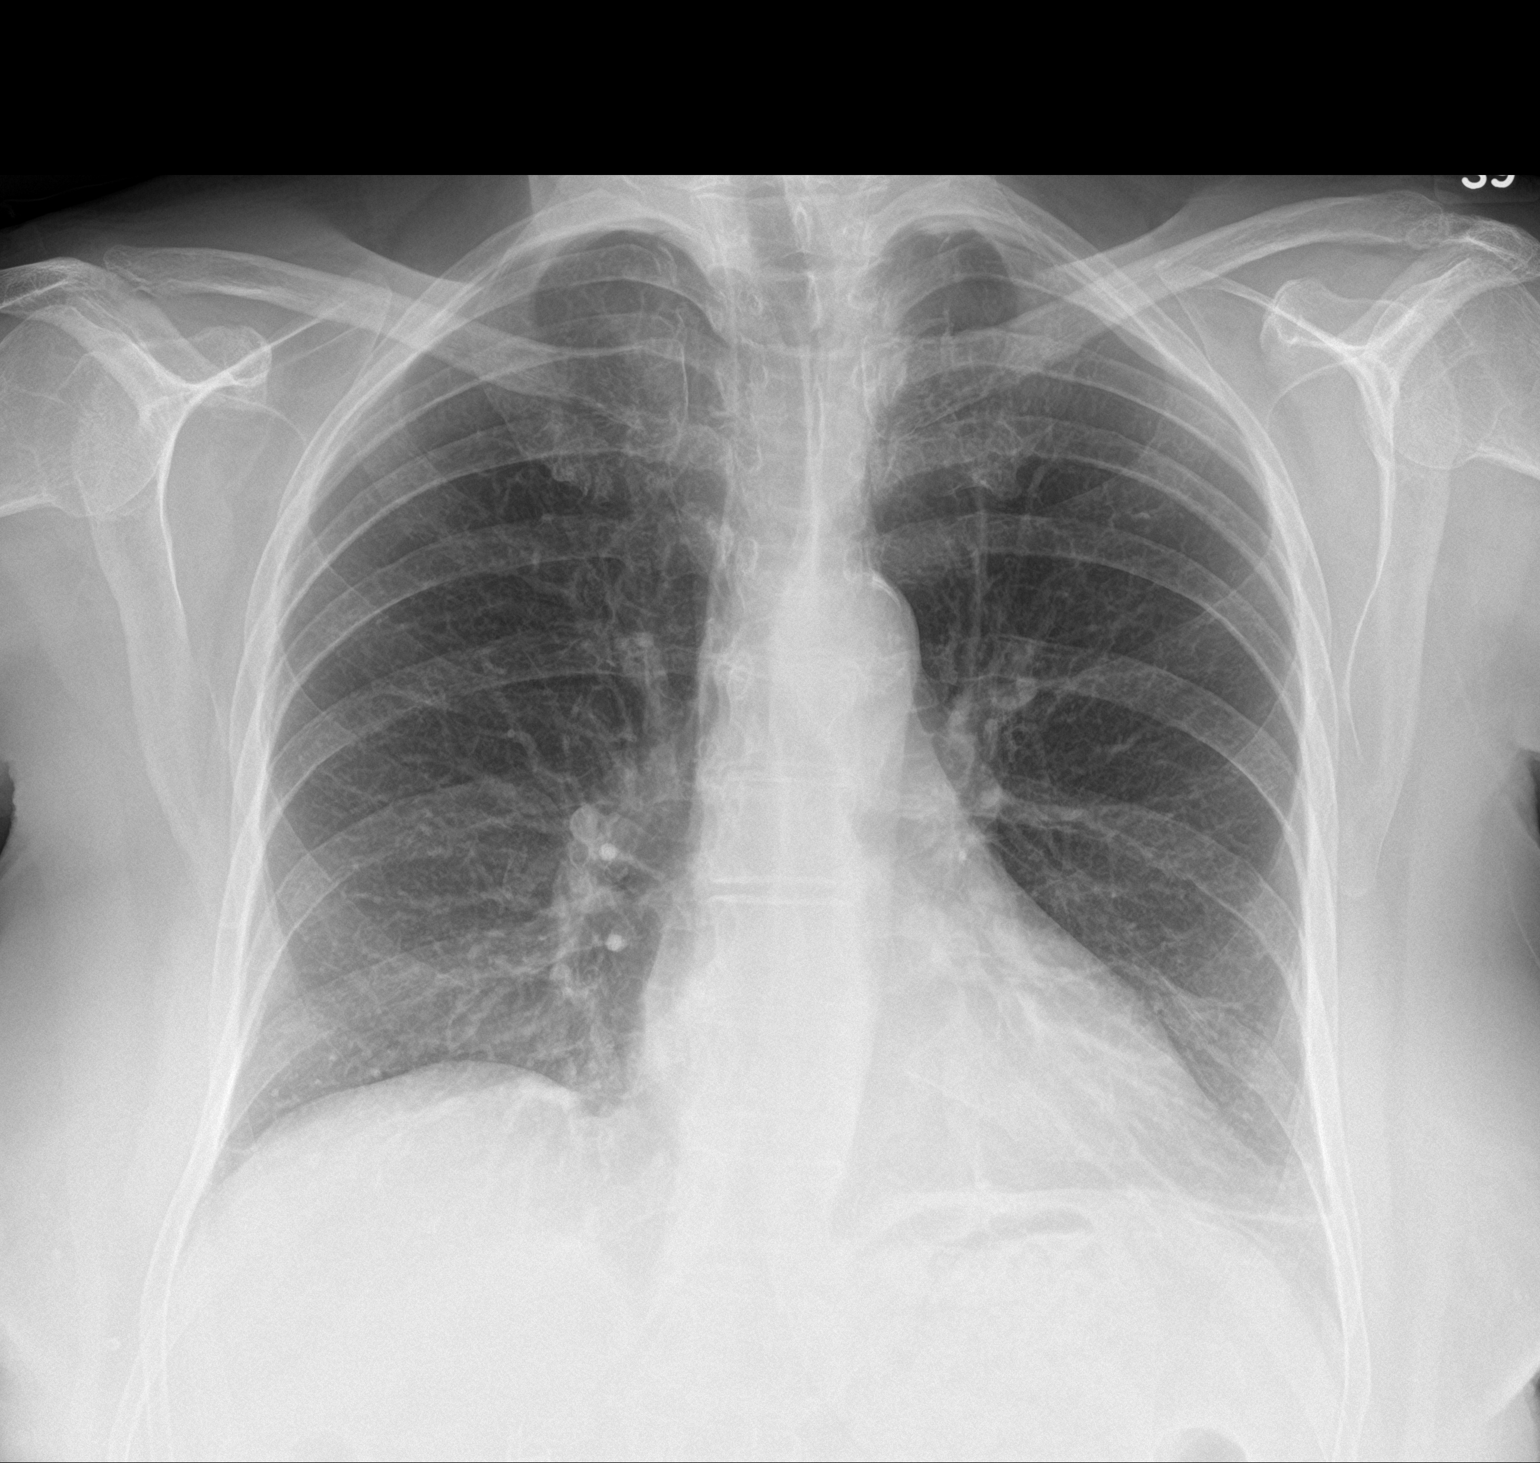

[chest lat]
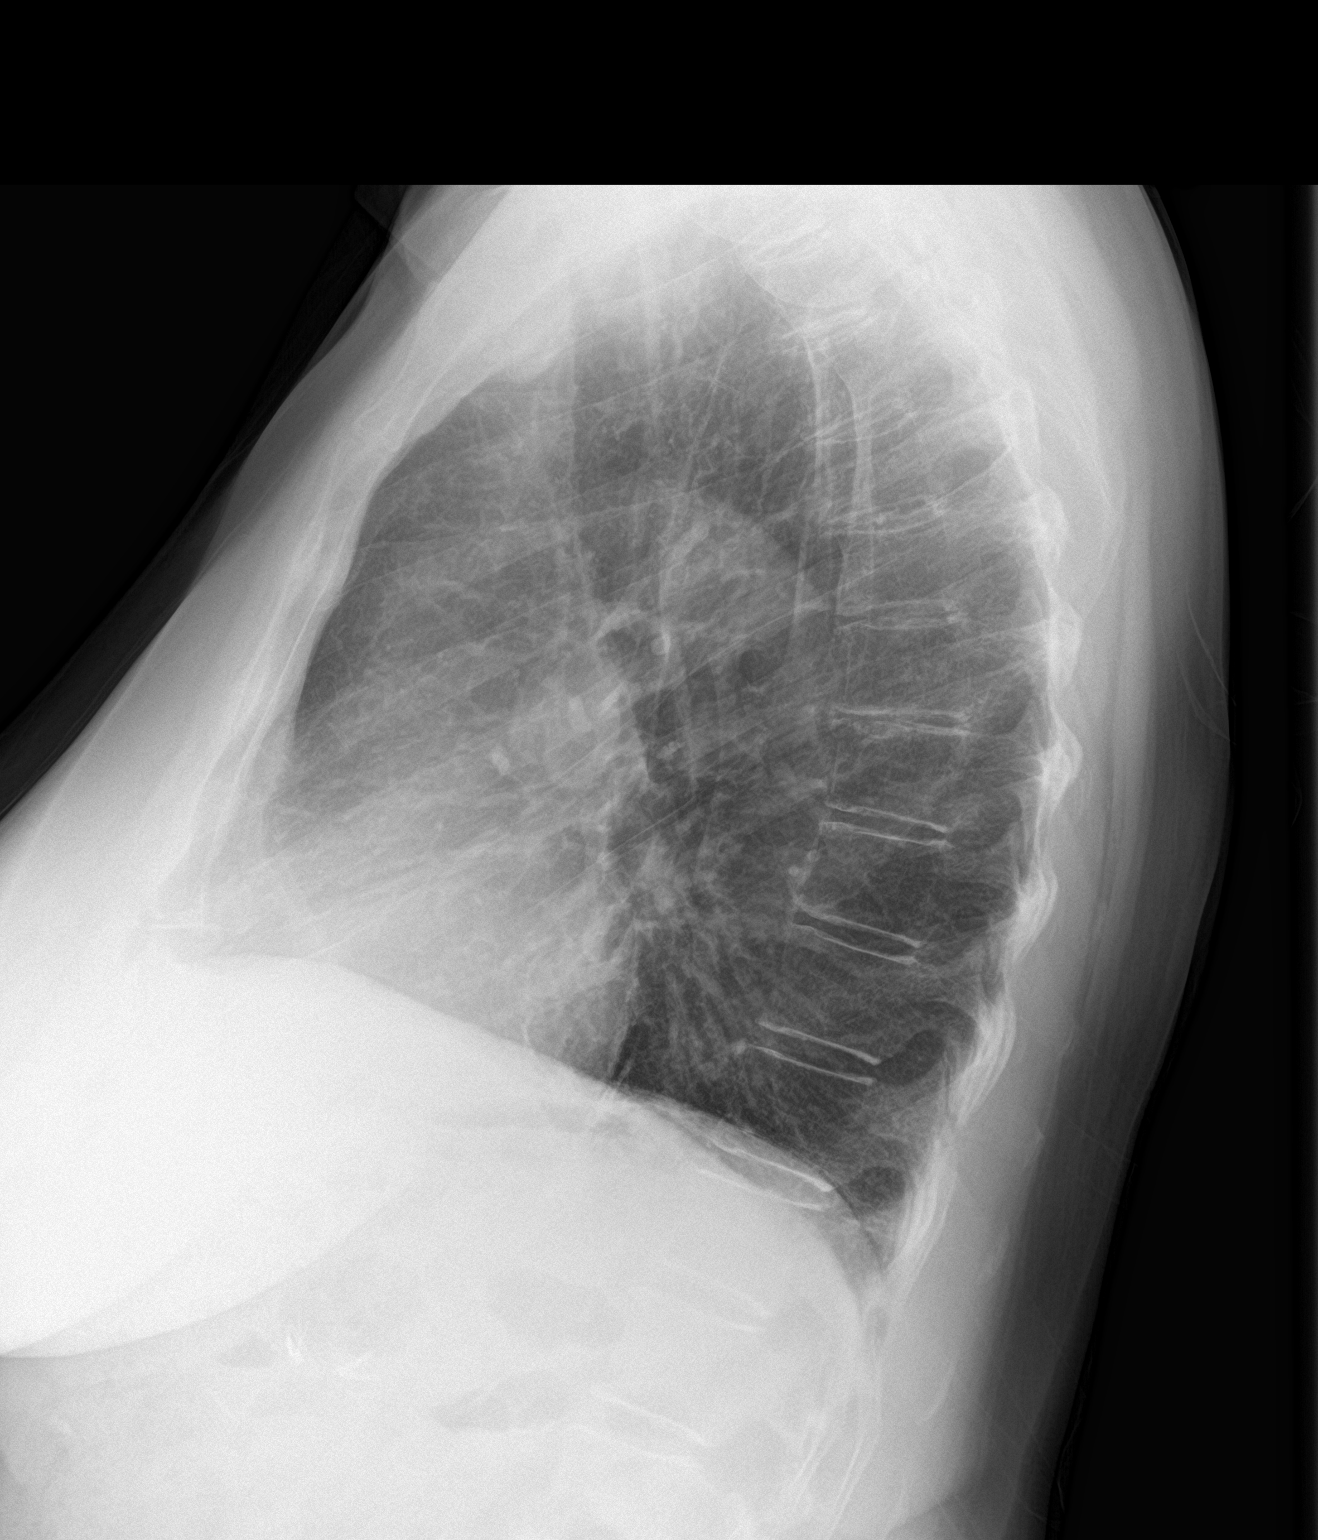

[2 of 2 positions shown; findings below may reference images not displayed]

FINDINGS: Subsegmental atelectasis at the left base. Normal heart size. Upper
lungs clear. No pneumothorax or pleural effusion.
IMPRESSION: No active cardiopulmonary disease.

## 2015-11-16 NOTE — Progress Notes (Signed)
Subjective:    Patient ID: Desiree Spencer, female    DOB: Jun 23, 1940, 75 y.o.   MRN: [id number]  HPI  Desiree Spencer is a 75 year old female who presents today with left sided chest discomfort that has been present for 4 days. Pain is noted as a 5 to 6 and described as aching at times. No associated symptoms. She denies palpitations, SOB, N/V, shoulder pain, jaw pain, fatigue, numbness, tingling, weakness, headaches, nosebleeds, edema, claudication, dizziness, syncope, pain with inspiration, unilateral leg or calf swelling,or reflux. She denies any triggers that may have been a source for musculoskeletal pain. No recent immobilization or history of trauma/surgery. Treatment at home includes limited use of ibuprofen which has provided limited benefit. Relief occurs with lying down. Pain does not worsen with exertion and is not exacerbated with respirations. She has a history of GERD but reports that this is controlled with ranitidine and weight loss.  Review of Systems  Constitutional: Negative for chills, fatigue and fever.  Respiratory: Negative for cough, chest tightness, shortness of breath and wheezing.   Cardiovascular: Negative for palpitations and leg swelling.       Chest discomfort noted on left upper chest medial to axillae   Gastrointestinal: Negative for abdominal pain, diarrhea, nausea and vomiting.  Genitourinary: Negative for dysuria.  Musculoskeletal: Negative for arthralgias, back pain, joint swelling and myalgias.  Skin: Negative for pallor and rash.  Neurological: Negative for dizziness, syncope, facial asymmetry, weakness, light-headedness, numbness and headaches.  Psychiatric/Behavioral:       Denies depressed or anxious mood   Past Medical History:  Diagnosis Date  . Acute cystitis 07/22/2007  . ALLERGIC RHINITIS 08/14/2006  . [place] DISEASE, LUMBAR 08/07/2009  . DIVERTICULITIS, ACUTE 06/07/2008  . Extrinsic asthma, unspecified 03/30/2007  . GERD 08/14/2006  . HEPATITIS  B, HX OF 08/14/2006  . HYPERLIPIDEMIA 07/01/2008  . HYPERTENSION 08/14/2006  . OSTEOARTHRITIS 08/14/2006  . PILAR CYST 07/20/2008  . ROTATOR CUFF INJURY, RIGHT SHOULDER 11/11/2008     Social History   Social History  . Marital status: Married    Spouse name: N/A  . Number of children: N/A  . Years of education: N/A   Occupational History  . Not on file.   Social History Main Topics  . Smoking status: Never Smoker  . Smokeless tobacco: Never Used  . Alcohol use Not on file  . Drug use: Unknown  . Sexual activity: Not on file   Other Topics Concern  . Not on file   Social History Narrative  . No narrative on file    Past Surgical History:  Procedure Laterality Date  . ABDOMINAL HYSTERECTOMY    . childbirth     X 2  . CHOLECYSTECTOMY      Family History  Problem Relation Age of Onset  . Hyperlipidemia Other   . Hypertension Other     No Known Allergies  Current Outpatient Prescriptions on File Prior to Visit  Medication Sig Dispense Refill  . aspirin 81 MG tablet Take 81 mg by mouth daily.      . Calcium-Magnesium-Vitamin D (CALCIUM 500 PO) Take by mouth.    . fish oil-omega-3 fatty acids 1000 MG capsule Take 1 g by mouth daily.      . hydrochlorothiazide (HYDRODIURIL) 25 MG tablet Take 1 tablet (25 mg total) by mouth daily. 100 tablet 4  . magnesium oxide (MAG-OX) 400 MG tablet Take 400 mg by mouth daily.    . Melatonin 5 MG TABS  Take by mouth.    . Potassium 99 MG TABS Take by mouth.    . ranitidine (ZANTAC) 150 MG tablet Take 150 mg by mouth daily.    . fluticasone (FLONASE) 50 MCG/ACT nasal spray Place into both nostrils daily.    Marland Kitchen HYDROcodone-homatropine (HYCODAN) 5-1.5 MG/5ML syrup Take 5 mLs by mouth every 8 (eight) hours as needed for cough. (Patient not taking: Reported on 11/16/2015) 120 mL 0   No current facility-administered medications on file prior to visit.     BP (!) 148/78   Pulse 87   Temp 97.8 F (36.6 C) (Oral)   Wt 150 lb 3.2 oz (68.1  kg)   SpO2 93%   BMI 24.99 kg/m       Objective:   Physical Exam  Constitutional: She is oriented to person, place, and time. She appears well-developed and well-nourished.  Eyes: No scleral icterus.  Neck: Neck supple.  Cardiovascular: Normal rate, regular rhythm and intact distal pulses.   Pulmonary/Chest: Effort normal and breath sounds normal. She has no wheezes. She has no rales.  Abdominal: Soft. Bowel sounds are normal. There is no tenderness. There is no rebound.  Musculoskeletal: She exhibits no edema.  Lymphadenopathy:    She has no cervical adenopathy.  Neurological: She is alert and oriented to person, place, and time. Coordination normal.  Skin: Skin is warm and dry. No rash noted.  Psychiatric: She has a normal mood and affect. Her behavior is normal. Judgment and thought content normal.          Assessment & Plan:   1. Chest pain, unspecified chest pain type EKG reviewed with NSR and is largely unchanged since last EKG 11/21/2014.  Exam and history are reassuring. Well's criteria score = 0.  Discussed that although EKG is noted as NSR, advised her that due to her age and risk factors of HTN, it is important to rule out cardiac cause of discomfort. While acute chest pain or PE suspicion if very low, will order stress testing to rule out cardiac cause of discomfort. Reviewed common causes of chest discomfort and warning signs that require immediate medical evaluation. Prior to this encounter, she declined work up in ED after speaking with triage nurse. She voiced understanding and agreed with plan.  - EKG 12-Lead - DG Chest 2 View; Future - Exercise Tolerance Test; Future  Delano Metz, FNP-C

## 2015-11-16 NOTE — Patient Instructions (Addendum)
Please go to [facility] for your chest X-ray. Also, you will be contacted regarding your stress test. If symptoms worsen and you experience increased chest pain, palpitations, SOB, Nausea, vomiting, or any new symptoms, please seek medical attention immediately.

## 2015-11-16 NOTE — Telephone Encounter (Signed)
FYI, pt coming in at 9:30 for Chest pain refused ED.

## 2015-11-16 NOTE — Progress Notes (Signed)
Pre visit review using our clinic review tool, if applicable. No additional management support is needed unless otherwise documented below in the visit note. 

## 2015-11-16 NOTE — Telephone Encounter (Signed)
Noted  

## 2015-11-16 NOTE — Telephone Encounter (Signed)
[facility] - Client [facility] Patient Name: Desiree Spencer DOB: 1940/04/24 Initial Comment Caller states she is having chest pain. Nurse Assessment Nurse: Markus Daft, RN, Sherre Poot Date/Time (Eastern Time): 11/16/2015 8:44:42 AM Confirm and document reason for call. If symptomatic, describe symptoms. You must click the next button to save text entered. ---Caller states she is having chest pain like dull ache. Started Monday AM lasting all day. When she lays down, there is no pain. Has happened each day like this. Today the pain is a little more severe. Rates now at 5/10. Takes ASA 81 mg every night. Ibuprofen doesn't seem to help. Took some last night. No cough, sweating, or difficulty breathing. Has the patient traveled out of the country within the last 30 days? ---Not Applicable Does the patient have any new or worsening symptoms? ---Yes Will a triage be completed? ---Yes Related visit to physician within the last 2 weeks? ---No Does the PT have any chronic conditions? (i.e. diabetes, asthma, etc.) ---Yes List chronic conditions. ---fluid retention in ankles and feet - takes HCTZ Is this a behavioral health or substance abuse call? ---No Guidelines Guideline Title Affirmed Question Affirmed Notes Chest Pain [1] Chest pain lasts > 5 minutes AND [2] age > 69 Final Disposition User Call EMS 911 Now Markus Daft, [place], [place] Disagree/Comply: Disagree Disagree/Comply Reason: Disagree with instructions Caller refuses to call 911, and also refuses to go to ED as she doesn't feel that it is that serious.  She really wants to be seen at office.  RN noted and advised that I would let the office know and someone would call her back of unknown time on return call.  Caller verbalized understanding.  -- RN contacted office and spoke with Sharyn Lull, office nurses.

## 2015-11-16 NOTE — Telephone Encounter (Signed)
Spoke to pt, asked her if she could come in this morning at 9:30 or 9:45? Pt said yes 9:30. Told pt okay I will schedule you for 9:30 to see Delano Metz. Pt verbalized understanding. Pt added to schedule.

## 2015-12-05 ENCOUNTER — Other Ambulatory Visit (INDEPENDENT_AMBULATORY_CARE_PROVIDER_SITE_OTHER): Payer: Medicare Other

## 2015-12-05 DIAGNOSIS — I10 Essential (primary) hypertension: Secondary | ICD-10-CM

## 2015-12-05 DIAGNOSIS — E785 Hyperlipidemia, unspecified: Secondary | ICD-10-CM | POA: Diagnosis not present

## 2015-12-05 LAB — LIPID PANEL
CHOL/HDL RATIO: 4
Cholesterol: 255 mg/dL — ABNORMAL HIGH (ref 0–200)
HDL: 57.2 mg/dL (ref >39.00)
LDL Cholesterol: 170 mg/dL — ABNORMAL HIGH (ref 0–99)
NONHDL: 197.75
TRIGLYCERIDES: 141 mg/dL (ref 0.0–149.0)
VLDL: 28.2 mg/dL (ref 0.0–40.0)

## 2015-12-05 LAB — HEPATIC FUNCTION PANEL
ALBUMIN: 4.2 g/dL (ref 3.5–5.2)
ALK PHOS: 68 U/L (ref 39–117)
ALT: 16 U/L (ref 0–35)
AST: 19 U/L (ref 0–37)
BILIRUBIN DIRECT: 0 mg/dL (ref 0.0–0.3)
TOTAL PROTEIN: 7.4 g/dL (ref 6.0–8.3)
Total Bilirubin: 0.6 mg/dL (ref 0.2–1.2)

## 2015-12-05 LAB — POC URINALSYSI DIPSTICK (AUTOMATED)
BILIRUBIN UA: NEGATIVE
Blood, UA: NEGATIVE
GLUCOSE UA: NEGATIVE
KETONES UA: NEGATIVE
NITRITE UA: NEGATIVE
PH UA: 5.5
Protein, UA: NEGATIVE
Spec Grav, UA: 1.02
Urobilinogen, UA: 0.2

## 2015-12-05 LAB — CBC WITH DIFFERENTIAL/PLATELET
BASOS ABS: 0.1 10*3/uL (ref 0.0–0.1)
Basophils Relative: 1 % (ref 0.0–3.0)
EOS ABS: 0.2 10*3/uL (ref 0.0–0.7)
Eosinophils Relative: 2.8 % (ref 0.0–5.0)
HCT: 42.5 % (ref 36.0–46.0)
Hemoglobin: 14.6 g/dL (ref 12.0–15.0)
LYMPHS ABS: 3.8 10*3/uL (ref 0.7–4.0)
LYMPHS PCT: 50.9 % — AB (ref 12.0–46.0)
MCHC: 34.2 g/dL (ref 30.0–36.0)
MCV: 84.1 fl (ref 78.0–100.0)
MONOS PCT: 8.2 % (ref 3.0–12.0)
Monocytes Absolute: 0.6 10*3/uL (ref 0.1–1.0)
NEUTROS PCT: 37.1 % — AB (ref 43.0–77.0)
Neutro Abs: 2.8 10*3/uL (ref 1.4–7.7)
Platelets: 254 10*3/uL (ref 150.0–400.0)
RBC: 5.06 Mil/uL (ref 3.87–5.11)
RDW: 13.6 % (ref 11.5–15.5)
WBC: 7.5 10*3/uL (ref 4.0–10.5)

## 2015-12-05 LAB — TSH: TSH: 2.15 u[IU]/mL (ref 0.35–4.50)

## 2015-12-05 LAB — BASIC METABOLIC PANEL
BUN: 22 mg/dL (ref 6–23)
CALCIUM: 9.5 mg/dL (ref 8.4–10.5)
CO2: 30 meq/L (ref 19–32)
Chloride: 100 mEq/L (ref 96–112)
Creatinine, Ser: 0.91 mg/dL (ref 0.40–1.20)
GFR: 63.99 mL/min (ref >60.00)
GLUCOSE: 95 mg/dL (ref 70–99)
POTASSIUM: 3.4 meq/L — AB (ref 3.5–5.1)
SODIUM: 139 meq/L (ref 135–145)

## 2015-12-12 ENCOUNTER — Encounter: Payer: Medicare Other | Admitting: Family Medicine

## 2015-12-15 ENCOUNTER — Other Ambulatory Visit: Payer: Self-pay | Admitting: Family Medicine

## 2015-12-18 ENCOUNTER — Ambulatory Visit (INDEPENDENT_AMBULATORY_CARE_PROVIDER_SITE_OTHER): Payer: Medicare Other | Admitting: Family Medicine

## 2015-12-18 ENCOUNTER — Encounter: Payer: Self-pay | Admitting: Family Medicine

## 2015-12-18 VITALS — BP 138/82 | HR 96 | Temp 98.0°F | Ht 64.5 in | Wt 155.4 lb

## 2015-12-18 DIAGNOSIS — Z0000 Encounter for general adult medical examination without abnormal findings: Secondary | ICD-10-CM | POA: Diagnosis not present

## 2015-12-18 DIAGNOSIS — S81812A Laceration without foreign body, left lower leg, initial encounter: Secondary | ICD-10-CM | POA: Insufficient documentation

## 2015-12-18 DIAGNOSIS — I10 Essential (primary) hypertension: Secondary | ICD-10-CM | POA: Diagnosis not present

## 2015-12-18 DIAGNOSIS — Z23 Encounter for immunization: Secondary | ICD-10-CM

## 2015-12-18 DIAGNOSIS — E785 Hyperlipidemia, unspecified: Secondary | ICD-10-CM

## 2015-12-18 MED ORDER — HYDROCHLOROTHIAZIDE 25 MG PO TABS: 25.0000 mg | ORAL_TABLET | Freq: Every day | ORAL | 4 refills | Status: DC

## 2015-12-18 NOTE — Progress Notes (Signed)
Pre visit review using our clinic review tool, if applicable. No additional management support is needed unless otherwise documented below in the visit note. 

## 2015-12-18 NOTE — Progress Notes (Signed)
Desiree Spencer is a 75 year old married female nonsmoker who comes in today for general physical examination because of a history of allergic rhinitis, sleep dysfunction, hypertension  She said she was here a couple months ago because of left chest wall pain. She said 1 when she woke up at a dull pain and points to her left upper chest area. She said it was an 8 on a scale of 1-10 no history of trauma. Was constant. She came in her chest x-ray which was normal. The pain just went away spontaneously. I reassured her most likely chest wall pain  She takes Flonase over-the-counter for allergic rhinitis  She did use trazodone for sleep dysfunction for your and half but it caused hair loss. She then stop the trazodone. She's taken over-the-counter melatonin and Unisom.  She gets routine eye care, dental care, BSE monthly, annual mammography, colonoscopy 2010 was normal.  Vaccinations will give her a tetanus booster day 2 she has a cut on her left lower extremity.  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 Therefore pelvic and Pap no longer indicated.  Cognitive function normal she walks daily home health safety reviewed no issues identified, no guns in the house, she does have a healthcare power of attorney and living well  14 point review of systems reviewed and are negative  Physical examination vital signs stable she's afebrile HEENT were negative except for bilateral cataracts. Refer to her ophthalmologist Dr. Kathrin Penner. Neck was supple thyroid is not enlarged no carotid bruits cardiopulmonary exam normal breast exam normal abdominal exam normal extremities normal skin normal peripheral pulses normal.  #1 impression hypertension at goal continue hydrochlorothiazide 25 mg daily  Allergic rhinitis continue Flonase  Sleep dysfunction continue milrinone tone and and Unisom  Hyperacidity OTC Zantac when necessary  Bilateral cataracts.......Marland Kitchen refer to her family  ophthalmologist Dr. Kathrin Penner

## 2016-01-17 ENCOUNTER — Other Ambulatory Visit: Payer: Self-pay

## 2016-06-12 ENCOUNTER — Ambulatory Visit (INDEPENDENT_AMBULATORY_CARE_PROVIDER_SITE_OTHER): Payer: Medicare Other | Admitting: Family Medicine

## 2016-06-12 ENCOUNTER — Encounter: Payer: Self-pay | Admitting: Family Medicine

## 2016-06-12 VITALS — BP 140/90 | HR 79 | Temp 97.7°F | Wt 153.6 lb

## 2016-06-12 DIAGNOSIS — R300 Dysuria: Secondary | ICD-10-CM | POA: Diagnosis not present

## 2016-06-12 LAB — POCT URINALYSIS DIPSTICK
Bilirubin, UA: NEGATIVE
Glucose, UA: NEGATIVE
Ketones, UA: NEGATIVE
Nitrite, UA: NEGATIVE
Protein, UA: NEGATIVE
Spec Grav, UA: 1.01 (ref 1.010–1.025)
Urobilinogen, UA: 0.2 E.U./dL
pH, UA: 8 (ref 5.0–8.0)

## 2016-06-12 MED ORDER — CEPHALEXIN 500 MG PO CAPS: 500.0000 mg | ORAL_CAPSULE | Freq: Three times a day (TID) | ORAL | 0 refills | Status: DC

## 2016-06-12 NOTE — Patient Instructions (Signed)

## 2016-06-12 NOTE — Progress Notes (Addendum)
Subjective:     Patient ID: Desiree Spencer, female   DOB: 01-11-1941, 76 y.o.   MRN: [id number]  HPI Patient seen as a work in today with onset this morning of urine frequency and burning with urination. Denies any fever. She has had a couple of chills. No flank pain. No abdominal pain. Denies any nausea or vomiting. No known drug allergies.  Past Medical History:  Diagnosis Date  . Acute cystitis 07/22/2007  . ALLERGIC RHINITIS 08/14/2006  . [place] DISEASE, LUMBAR 08/07/2009  . DIVERTICULITIS, ACUTE 06/07/2008  . Extrinsic asthma, unspecified 03/30/2007  . GERD 08/14/2006  . HEPATITIS B, HX OF 08/14/2006  . HYPERLIPIDEMIA 07/01/2008  . HYPERTENSION 08/14/2006  . OSTEOARTHRITIS 08/14/2006  . PILAR CYST 07/20/2008  . ROTATOR CUFF INJURY, RIGHT SHOULDER 11/11/2008   Past Surgical History:  Procedure Laterality Date  . ABDOMINAL HYSTERECTOMY    . childbirth     X 2  . CHOLECYSTECTOMY      reports that she has never smoked. She has never used smokeless tobacco. Her alcohol and drug histories are not on file. family history includes Hyperlipidemia in her other; Hypertension in her other. No Known Allergies   Review of Systems  Constitutional: Negative for appetite change, chills and fever.  Gastrointestinal: Negative for abdominal pain, constipation, diarrhea, nausea and vomiting.  Genitourinary: Positive for dysuria and frequency.  Musculoskeletal: Negative for back pain.  Neurological: Negative for dizziness.       Objective:   Physical Exam  Constitutional: She appears well-developed and well-nourished.  HENT:  Head: Normocephalic and atraumatic.  Neck: Neck supple. No thyromegaly present.  Cardiovascular: Normal rate, regular rhythm and normal heart sounds.   Pulmonary/Chest: Breath sounds normal.  Abdominal: Soft. Bowel sounds are normal. There is no tenderness.       Assessment:     Probable uncomplicated cystitis    Plan:     -Urine culture sent -Increase  hydration -Keflex 500 mg 3 times a day for 7 days pending culture results -Follow-up promptly for any persistent or worsening symptoms  Eulas Post MD [facility]  Urine culture came back positive for Citrobacter species. Resistant to Keflex. Discontinue Keflex and start Cipro 500 mg twice daily for 5 days. Patient notified.  Eulas Post MD [facility]

## 2016-06-12 NOTE — Progress Notes (Signed)
Pre visit review using our clinic review tool, if applicable. No additional management support is needed unless otherwise documented below in the visit note. 

## 2016-06-14 LAB — URINE CULTURE

## 2016-06-14 MED ORDER — CIPROFLOXACIN HCL 500 MG PO TABS: 500.0000 mg | ORAL_TABLET | Freq: Two times a day (BID) | ORAL | 0 refills | Status: DC

## 2016-06-14 NOTE — Addendum Note (Signed)
Addended by: Eulas Post on: 06/14/2016 05:29 PM   Modules accepted: Orders

## 2016-07-06 ENCOUNTER — Encounter: Payer: Self-pay | Admitting: Family Medicine

## 2016-07-06 ENCOUNTER — Ambulatory Visit (INDEPENDENT_AMBULATORY_CARE_PROVIDER_SITE_OTHER): Payer: Medicare Other | Admitting: Family Medicine

## 2016-07-06 ENCOUNTER — Other Ambulatory Visit (HOSPITAL_COMMUNITY)
Admission: RE | Admit: 2016-07-06 | Discharge: 2016-07-06 | Disposition: A | Discharge status: Home / Self Care | Payer: Medicare Other | Source: Other Acute Inpatient Hospital | Attending: Family Medicine | Admitting: Family Medicine

## 2016-07-06 VITALS — BP 134/86 | HR 98 | Temp 98.3°F | Wt 154.0 lb

## 2016-07-06 DIAGNOSIS — R300 Dysuria: Secondary | ICD-10-CM | POA: Insufficient documentation

## 2016-07-06 LAB — POCT URINALYSIS DIPSTICK
Bilirubin, UA: NEGATIVE
Glucose, UA: NEGATIVE
Ketones, UA: NEGATIVE
LEUKOCYTES UA: NEGATIVE
Nitrite, UA: NEGATIVE
PH UA: 6.5 (ref 5.0–8.0)
PROTEIN UA: NEGATIVE
SPEC GRAV UA: 1.015 (ref 1.010–1.025)
Urobilinogen, UA: 0.2 E.U./dL

## 2016-07-06 LAB — URINALYSIS, ROUTINE W REFLEX MICROSCOPIC
Bilirubin Urine: NEGATIVE
GLUCOSE, UA: NEGATIVE mg/dL
KETONES UR: NEGATIVE mg/dL
Nitrite: NEGATIVE
PH: 7 (ref 5.0–8.0)
Protein, ur: NEGATIVE mg/dL
Specific Gravity, Urine: 1.008 (ref 1.005–1.030)
Squamous Epithelial / LPF: NONE SEEN

## 2016-07-06 MED ORDER — SULFAMETHOXAZOLE-TRIMETHOPRIM 800-160 MG PO TABS: 1.0000 | ORAL_TABLET | Freq: Two times a day (BID) | ORAL | 0 refills | Status: DC

## 2016-07-06 NOTE — Patient Instructions (Signed)
We are going to treat you for a likely UTI with septra- take it twice a day for 3-5 days.  I will be in touch with your urine culture asap Please seek care if you are getting worse or not better in 2 days

## 2016-07-06 NOTE — Progress Notes (Signed)
Desiree Spencer at [facility] [address] [place], [place] [address] [phone]  Date:  07/06/2016   Name:  Desiree Spencer   DOB:  [birth year]   MRN:  [id number]  PCP:  Dorena Cookey, MD    Chief Complaint: Dysuria (Started this AM. )   History of Present Illness:  Desiree Spencer is a 76 y.o. very pleasant female patient who presents with the following:  When she got up this am she noted onset of dysuria- burning with urination and a feeling that she had to urinate when she just voided.  She has had a UTI several times in the past and this seems consistent with her usual sx No hematuria She is othewise feling well No back pain, belly pain or fever Eating normally No rash  She did have a citrobactor UTI in April - treated with cipro  She has no medication allergies     Patient Active Problem List   Diagnosis Date Noted  . Laceration of left lower extremity 12/18/2015  . Infection of urinary tract 02/28/2015  . Candida UTI 02/28/2015  . Maxillary sinusitis, acute 02/15/2015  . Insomnia 07/04/2014  . Routine general medical examination at a health care facility 10/16/2011  . Pain in right ankle 10/30/2010  . Keratosis 08/27/2010  . Hyperlipidemia 07/01/2008  . Essential hypertension 08/14/2006  . ALLERGIC RHINITIS 08/14/2006  . GERD 08/14/2006  . OSTEOARTHRITIS 08/14/2006  . HEPATITIS B, HX OF 08/14/2006    Past Medical History:  Diagnosis Date  . Acute cystitis 07/22/2007  . ALLERGIC RHINITIS 08/14/2006  . [place] DISEASE, LUMBAR 08/07/2009  . DIVERTICULITIS, ACUTE 06/07/2008  . Extrinsic asthma, unspecified 03/30/2007  . GERD 08/14/2006  . HEPATITIS B, HX OF 08/14/2006  . HYPERLIPIDEMIA 07/01/2008  . HYPERTENSION 08/14/2006  . OSTEOARTHRITIS 08/14/2006  . PILAR CYST 07/20/2008  . ROTATOR CUFF INJURY, RIGHT SHOULDER 11/11/2008    Past Surgical History:  Procedure Laterality Date  . ABDOMINAL HYSTERECTOMY    . childbirth      X 2  . CHOLECYSTECTOMY      Social History  Substance Use Topics  . Smoking status: Never Smoker  . Smokeless tobacco: Never Used  . Alcohol use Not on file    Family History  Problem Relation Age of Onset  . Hyperlipidemia Other   . Hypertension Other     No Known Allergies  Medication list has been reviewed and updated.  Current Outpatient Prescriptions on File Prior to Visit  Medication Sig Dispense Refill  . aspirin 81 MG tablet Take 81 mg by mouth daily.      . Calcium-Magnesium-Vitamin D (CALCIUM 500 PO) Take by mouth.    . fish oil-omega-3 fatty acids 1000 MG capsule Take 1 g by mouth daily.      . fluticasone (FLONASE) 50 MCG/ACT nasal spray Place into both nostrils daily.    . hydrochlorothiazide (HYDRODIURIL) 25 MG tablet Take 1 tablet (25 mg total) by mouth daily. 100 tablet 4  . magnesium oxide (MAG-OX) 400 MG tablet Take 400 mg by mouth daily.    . Melatonin 5 MG TABS Take by mouth.    . Potassium 99 MG TABS Take by mouth.    . ranitidine (ZANTAC) 150 MG tablet Take 150 mg by mouth daily.    . ciprofloxacin (CIPRO) 500 MG tablet Take 1 tablet (500 mg total) by mouth 2 (two) times daily. (Patient not taking: Reported on 07/06/2016)  10 tablet 0   No current facility-administered medications on file prior to visit.     Review of Systems:  As per HPI- otherwise negative.   Physical Examination: Vitals:   07/06/16 1236  BP: 134/86  Pulse: 98  Temp: 98.3 F (36.8 C)   Vitals:   07/06/16 1236  Weight: 154 lb (69.9 kg)   Body mass index is 26.03 kg/m. Ideal Body Weight:    GEN: WDWN, NAD, Non-toxic, A & O x 3, looks well HEENT: Atraumatic, Normocephalic. Neck supple. No masses, No LAD. Ears and Nose: No external deformity. CV: RRR, No M/G/R. No JVD. No thrill. No extra heart sounds. PULM: CTA B, no wheezes, crackles, rhonchi. No retractions. No resp. distress. No accessory muscle use. ABD: S, NT, ND, +BS. No rebound. No HSM.  No CVA  tenderness EXTR: No c/c/e NEURO Normal gait.  PSYCH: Normally interactive. Conversant. Not depressed or anxious appearing.  Calm demeanor.   Results for orders placed or performed in visit on 07/06/16  POCT urinalysis dipstick  Result Value Ref Range   Color, UA yellow    Clarity, UA clear    Glucose, UA neg    Bilirubin, UA neg    Ketones, UA neg    Spec Grav, UA 1.015 1.010 - 1.025   Blood, UA 25ery    pH, UA 6.5 5.0 - 8.0   Protein, UA neg    Urobilinogen, UA 0.2 0.2 or 1.0 E.U./dL   Nitrite, UA neg    Leukocytes, UA Negative Negative     Assessment and Plan: Dysuria - Plan: POCT urinalysis dipstick, Urine culture, Urinalysis, Routine w reflex microscopic, sulfamethoxazole-trimethoprim (BACTRIM DS,SEPTRA DS) 800-160 MG tablet  Here today with likely UTI Await culture but will start treatment with septra- recently treated with cipro and infection returned Will plan further follow- up pending labs.   Signed Lamar Blinks, MD

## 2016-07-07 ENCOUNTER — Encounter: Payer: Self-pay | Admitting: Family Medicine

## 2016-07-07 LAB — URINE CULTURE: Culture: <10000 — AB

## 2016-07-29 DIAGNOSIS — H2513 Age-related nuclear cataract, bilateral: Secondary | ICD-10-CM | POA: Diagnosis not present

## 2016-07-29 DIAGNOSIS — H5201 Hypermetropia, right eye: Secondary | ICD-10-CM | POA: Diagnosis not present

## 2016-07-29 DIAGNOSIS — H5212 Myopia, left eye: Secondary | ICD-10-CM | POA: Diagnosis not present

## 2016-09-12 DIAGNOSIS — M47816 Spondylosis without myelopathy or radiculopathy, lumbar region: Secondary | ICD-10-CM | POA: Diagnosis not present

## 2016-09-12 DIAGNOSIS — M9903 Segmental and somatic dysfunction of lumbar region: Secondary | ICD-10-CM | POA: Diagnosis not present

## 2016-09-16 DIAGNOSIS — M9903 Segmental and somatic dysfunction of lumbar region: Secondary | ICD-10-CM | POA: Diagnosis not present

## 2016-09-16 DIAGNOSIS — M47816 Spondylosis without myelopathy or radiculopathy, lumbar region: Secondary | ICD-10-CM | POA: Diagnosis not present

## 2016-09-18 DIAGNOSIS — M47816 Spondylosis without myelopathy or radiculopathy, lumbar region: Secondary | ICD-10-CM | POA: Diagnosis not present

## 2016-09-18 DIAGNOSIS — M9903 Segmental and somatic dysfunction of lumbar region: Secondary | ICD-10-CM | POA: Diagnosis not present

## 2016-09-23 DIAGNOSIS — M9903 Segmental and somatic dysfunction of lumbar region: Secondary | ICD-10-CM | POA: Diagnosis not present

## 2016-09-23 DIAGNOSIS — M47816 Spondylosis without myelopathy or radiculopathy, lumbar region: Secondary | ICD-10-CM | POA: Diagnosis not present

## 2016-09-26 DIAGNOSIS — M47816 Spondylosis without myelopathy or radiculopathy, lumbar region: Secondary | ICD-10-CM | POA: Diagnosis not present

## 2016-09-26 DIAGNOSIS — M9903 Segmental and somatic dysfunction of lumbar region: Secondary | ICD-10-CM | POA: Diagnosis not present

## 2016-10-07 DIAGNOSIS — M47816 Spondylosis without myelopathy or radiculopathy, lumbar region: Secondary | ICD-10-CM | POA: Diagnosis not present

## 2016-10-07 DIAGNOSIS — M9903 Segmental and somatic dysfunction of lumbar region: Secondary | ICD-10-CM | POA: Diagnosis not present

## 2016-10-09 DIAGNOSIS — M47816 Spondylosis without myelopathy or radiculopathy, lumbar region: Secondary | ICD-10-CM | POA: Diagnosis not present

## 2016-10-09 DIAGNOSIS — M9903 Segmental and somatic dysfunction of lumbar region: Secondary | ICD-10-CM | POA: Diagnosis not present

## 2016-10-14 DIAGNOSIS — M9903 Segmental and somatic dysfunction of lumbar region: Secondary | ICD-10-CM | POA: Diagnosis not present

## 2016-10-14 DIAGNOSIS — M47816 Spondylosis without myelopathy or radiculopathy, lumbar region: Secondary | ICD-10-CM | POA: Diagnosis not present

## 2016-10-16 DIAGNOSIS — M47816 Spondylosis without myelopathy or radiculopathy, lumbar region: Secondary | ICD-10-CM | POA: Diagnosis not present

## 2016-10-16 DIAGNOSIS — M9903 Segmental and somatic dysfunction of lumbar region: Secondary | ICD-10-CM | POA: Diagnosis not present

## 2016-10-22 ENCOUNTER — Encounter: Payer: Self-pay | Admitting: Family Medicine

## 2016-10-22 DIAGNOSIS — Z1231 Encounter for screening mammogram for malignant neoplasm of breast: Secondary | ICD-10-CM | POA: Diagnosis not present

## 2016-10-22 NOTE — Progress Notes (Addendum)
HPI:   [name] is a 76 y.o. female, who is here today to establish care.  Former PCP: Dr Sherren Mocha Last preventive routine visit: 06/2016.   She lives with her husband. Independent ADL's and IADL's.   Chronic medical problems: HTN,insomnia,OA,allergies,Hx of HBV infection,GERD, and HLD among some.  Hypertension:   Dx many years ago. She also takes medication for "fluid retention."  Currently on HCTZ 25 mg daily.   Last eye exam June 2018.  She follows a low-salt diet. BP readings at home reported as "always good."  She is taking medications as instructed, no side effects reported.  She has not noted unusual headache, visual changes, exertional chest pain, dyspnea,  focal weakness, or edema.   Lab Results  Component Value Date   CREATININE 0.91 12/05/2015   BUN 22 12/05/2015   NA 139 12/05/2015   K 3.4 (L) 12/05/2015   CL 100 12/05/2015   CO2 30 12/05/2015   HypoK+, she is on K+ supplementation.   HLD: On non pharmacologic treatment.  Lab Results  Component Value Date   CHOL 255 (H) 12/05/2015   HDL 57.20 12/05/2015   LDLCALC 170 (H) 12/05/2015   LDLDIRECT 203.3 08/27/2010   TRIG 141.0 12/05/2015   CHOLHDL 4 12/05/2015   Denies Hx of CVD.  Noted on problem list "Hepatitis B,Hx."She denies any Hx of hep B infection and not sure when this Dx was added.  Lab Results  Component Value Date   ALT 16 12/05/2015   AST 19 12/05/2015   ALKPHOS 68 12/05/2015   BILITOT 0.6 12/05/2015   Insomnia: She takes Melatonin 5 mg OTC, which helps. sleeps about 7-8 hours. She feels rested when she wakes up in the morning.   Allergic rhinitis: She is on Flonase nasal spray daily as needed and OTC antihistaminic (Allerphex). Currently symptoms are stable. Denies fever,chills, facial pain,or sore throat.   GERD: She takes Zantac 150 mg daily. Symptoms well controlled. Denies abdominal pain, nausea, vomiting, changes in bowel habits, blood in  stool or melena.   Review of Systems  Constitutional: Negative for activity change, appetite change, fatigue, fever and unexpected weight change.  HENT: Negative for mouth sores, nosebleeds, sore throat and trouble swallowing.   Eyes: Negative for redness and visual disturbance.  Respiratory: Negative for cough, shortness of breath and wheezing.   Cardiovascular: Negative for chest pain, palpitations and leg swelling.  Gastrointestinal: Negative for abdominal pain, nausea and vomiting.       Negative for changes in bowel habits.  Endocrine: Negative for cold intolerance, heat intolerance, polydipsia, polyphagia and polyuria.  Genitourinary: Negative for decreased urine volume, dysuria and hematuria.  Musculoskeletal: Positive for arthralgias. Negative for gait problem and myalgias.  Skin: Negative for pallor and rash.  Allergic/Immunologic: Positive for environmental allergies.  Neurological: Negative for syncope, weakness and headaches.  Psychiatric/Behavioral: Negative for confusion and sleep disturbance.      Current Outpatient Prescriptions on File Prior to Visit  Medication Sig Dispense Refill  . aspirin 81 MG tablet Take 81 mg by mouth daily.      . Calcium-Magnesium-Vitamin D (CALCIUM 500 PO) Take by mouth.    . fish oil-omega-3 fatty acids 1000 MG capsule Take 1 g by mouth daily.      . fluticasone (FLONASE) 50 MCG/ACT nasal spray Place into both nostrils daily.    . magnesium oxide (MAG-OX) 400 MG tablet Take 400 mg by mouth daily.    . Melatonin 5 MG  TABS Take by mouth.    . Potassium 99 MG TABS Take by mouth.    . ranitidine (ZANTAC) 150 MG tablet Take 150 mg by mouth daily.     No current facility-administered medications on file prior to visit.      Past Medical History:  Diagnosis Date  . Acute cystitis 07/22/2007  . ALLERGIC RHINITIS 08/14/2006  . [place] DISEASE, LUMBAR 08/07/2009  . DIVERTICULITIS, ACUTE 06/07/2008  . Extrinsic asthma, unspecified 03/30/2007  . GERD  08/14/2006  . HEPATITIS B, HX OF 08/14/2006  . HYPERLIPIDEMIA 07/01/2008  . HYPERTENSION 08/14/2006  . OSTEOARTHRITIS 08/14/2006  . PILAR CYST 07/20/2008  . ROTATOR CUFF INJURY, RIGHT SHOULDER 11/11/2008   No Known Allergies  Family History  Problem Relation Age of Onset  . Hyperlipidemia Other   . Hypertension Other     Social History   Social History  . Marital status: Married    Spouse name: N/A  . Number of children: N/A  . Years of education: N/A   Social History Main Topics  . Smoking status: Never Smoker  . Smokeless tobacco: Never Used  . Alcohol use None  . Drug use: Unknown  . Sexual activity: Not Asked   Other Topics Concern  . None   Social History Narrative  . None    Vitals:   10/23/16 0652  BP: 130/76  Pulse: 83  Resp: 12  SpO2: 94%    Body mass index is 26.74 kg/m.   Physical Exam  Nursing note and vitals reviewed. Constitutional: She is oriented to person, place, and time. She appears well-developed and well-nourished. No distress.  HENT:  Head: Normocephalic and atraumatic.  Right Ear: Tympanic membrane, external ear and ear canal normal.  Left Ear: External ear normal.  Mouth/Throat: Oropharynx is clear and moist and mucous membranes are normal.  Cerumen excess left ear canal.  Eyes: Pupils are equal, round, and reactive to light. Conjunctivae and EOM are normal.  Neck: No tracheal deviation present. No thyroid mass and no thyromegaly present.  Cardiovascular: Normal rate and regular rhythm.   No murmur heard. Pulses:      Dorsalis pedis pulses are 2+ on the right side, and 2+ on the left side.  Respiratory: Effort normal and breath sounds normal. No respiratory distress.  GI: Soft. She exhibits no mass. There is no hepatomegaly. There is no tenderness.  Musculoskeletal: She exhibits edema (Trace pitting LE edema, bilateral.). She exhibits no tenderness.  Lymphadenopathy:    She has no cervical adenopathy.  Neurological: She is alert  and oriented to person, place, and time. She has normal strength. Coordination and gait normal.  Skin: Skin is warm. No rash noted. No erythema.  Psychiatric: She has a normal mood and affect.  Well groomed, good eye contact.    ASSESSMENT AND PLAN:   Ms. Kama was seen today for transfer and establish care.  Diagnoses and all orders for this visit:  Lab Results  Component Value Date   CHOL 244 (H) 10/24/2016   HDL 60.90 10/24/2016   LDLCALC 159 (H) 10/24/2016   LDLDIRECT 203.3 08/27/2010   TRIG 118.0 10/24/2016   CHOLHDL 4 10/24/2016   Lab Results  Component Value Date   CREATININE 0.80 10/24/2016   BUN 15 10/24/2016   NA 138 10/24/2016   K 3.4 (L) 10/24/2016   CL 99 10/24/2016   CO2 30 10/24/2016    Insomnia, unspecified type  Well controlled under current management. Good sleep hygiene to continue. F/U  annually.  Essential hypertension  Adequately controlled. No changes in current management. DASH-low salt diet recommended. Eye exam recommended annually. F/U in 12 months,pt preference.Monitor BP at home and follow before if needed.  -     hydrochlorothiazide (HYDRODIURIL) 25 MG tablet; Take 1 tablet (25 mg total) by mouth daily. -     Basic metabolic panel; Future  Hyperlipidemia, unspecified hyperlipidemia type  She is coming back tomorrow for fasting labs. Further recommendations will be given according to labs results.  The 10-year ASCVD risk score Mikey Bussing DC Brooke Bonito., et al., 2013) is: 24.3%   Values used to calculate the score:     Age: 60 years     Sex: Female     Is Non-Hispanic African American: No     Diabetic: No     Tobacco smoker: No     Systolic Blood Pressure: 557 mmHg     Is BP treated: Yes     HDL Cholesterol: 60.9 mg/dL     Total Cholesterol: 244 mg/dL  -     Lipid panel; Future  Allergic rhinitis, unspecified seasonality, unspecified trigger  Stable. No changes in current management. F/U in 12 months.  Gastroesophageal reflux  disease, esophagitis presence not specified  Stable. No changes in current management. GERD precautions discussed. F/U in 12 months.  Hypokalemia  Mild. Side effects of HCTZ dicussed. No changes in current management, will give further recommendations according to lab results.  -     Basic metabolic panel; Future  Need for influenza vaccination -     Flu vaccine HIGH DOSE PF  History of hepatitis B  It is not clear when this Dx was added (? 07/2016) She denies any Hx of hepatitis and I cannot find any documentation about this problem nor labs. I think it is important to clarify, so hep Bs Ag and ab ordered.   -     Hepatitis B surface antigen; Future -     Hepatitis B surface antibody; Future       Saher G. Martinique, MD  [facility]. [place] office.

## 2016-10-23 ENCOUNTER — Ambulatory Visit (INDEPENDENT_AMBULATORY_CARE_PROVIDER_SITE_OTHER): Payer: Medicare Other | Admitting: Family Medicine

## 2016-10-23 ENCOUNTER — Encounter: Payer: Self-pay | Admitting: Family Medicine

## 2016-10-23 VITALS — BP 130/76 | HR 83 | Resp 12 | Ht 64.5 in | Wt 158.2 lb

## 2016-10-23 DIAGNOSIS — E876 Hypokalemia: Secondary | ICD-10-CM

## 2016-10-23 DIAGNOSIS — J309 Allergic rhinitis, unspecified: Secondary | ICD-10-CM | POA: Diagnosis not present

## 2016-10-23 DIAGNOSIS — G4700 Insomnia, unspecified: Secondary | ICD-10-CM

## 2016-10-23 DIAGNOSIS — I10 Essential (primary) hypertension: Secondary | ICD-10-CM | POA: Diagnosis not present

## 2016-10-23 DIAGNOSIS — Z8619 Personal history of other infectious and parasitic diseases: Secondary | ICD-10-CM

## 2016-10-23 DIAGNOSIS — K219 Gastro-esophageal reflux disease without esophagitis: Secondary | ICD-10-CM | POA: Diagnosis not present

## 2016-10-23 DIAGNOSIS — E785 Hyperlipidemia, unspecified: Secondary | ICD-10-CM | POA: Diagnosis not present

## 2016-10-23 DIAGNOSIS — Z23 Encounter for immunization: Secondary | ICD-10-CM | POA: Diagnosis not present

## 2016-10-23 MED ORDER — HYDROCHLOROTHIAZIDE 25 MG PO TABS: 25.0000 mg | ORAL_TABLET | Freq: Every day | ORAL | 3 refills | Status: DC

## 2016-10-23 NOTE — Patient Instructions (Signed)
A few things to remember from today's visit:  Desiree Spencer, today we have followed on some of your chronic medical problems and they seem to be stable, so no changes in current management today.  Review medication list and be sure it is accurate.  -Remember a healthy diet and regular physical activity are very important for prevention as well as for well being; they also help with many chronic problems, decreasing the need of adding new medications and delaying or preventing possible complications.  I will see you back in 12 months. We may need lab done in 6 months depending of potassium.   Remember to arrange your follow up appt before leaving today.  Please follow sooner than planned if a new concern arises.   Insomnia, unspecified type  Essential hypertension - Plan: hydrochlorothiazide (HYDRODIURIL) 25 MG tablet, Basic metabolic panel  Hyperlipidemia, unspecified hyperlipidemia type - Plan: Lipid panel  Allergic rhinitis, unspecified seasonality, unspecified trigger  Gastroesophageal reflux disease, esophagitis presence not specified   Please be sure medication list is accurate. If a new problem present, please set up appointment sooner than planned today.

## 2016-10-24 ENCOUNTER — Other Ambulatory Visit (INDEPENDENT_AMBULATORY_CARE_PROVIDER_SITE_OTHER): Payer: Medicare Other

## 2016-10-24 DIAGNOSIS — E876 Hypokalemia: Secondary | ICD-10-CM

## 2016-10-24 DIAGNOSIS — Z8619 Personal history of other infectious and parasitic diseases: Secondary | ICD-10-CM | POA: Diagnosis not present

## 2016-10-24 DIAGNOSIS — E785 Hyperlipidemia, unspecified: Secondary | ICD-10-CM | POA: Diagnosis not present

## 2016-10-24 DIAGNOSIS — I10 Essential (primary) hypertension: Secondary | ICD-10-CM

## 2016-10-24 LAB — BASIC METABOLIC PANEL
BUN: 15 mg/dL (ref 6–23)
CALCIUM: 9.6 mg/dL (ref 8.4–10.5)
CO2: 30 meq/L (ref 19–32)
CREATININE: 0.8 mg/dL (ref 0.40–1.20)
Chloride: 99 mEq/L (ref 96–112)
GFR: 74.07 mL/min (ref >60.00)
Glucose, Bld: 102 mg/dL — ABNORMAL HIGH (ref 70–99)
Potassium: 3.4 mEq/L — ABNORMAL LOW (ref 3.5–5.1)
Sodium: 138 mEq/L (ref 135–145)

## 2016-10-24 LAB — LIPID PANEL
CHOL/HDL RATIO: 4
Cholesterol: 244 mg/dL — ABNORMAL HIGH (ref 0–200)
HDL: 60.9 mg/dL (ref >39.00)
LDL Cholesterol: 159 mg/dL — ABNORMAL HIGH (ref 0–99)
NONHDL: 182.97
Triglycerides: 118 mg/dL (ref 0.0–149.0)
VLDL: 23.6 mg/dL (ref 0.0–40.0)

## 2016-10-24 NOTE — Addendum Note (Signed)
Addended by: Tomi Likens on: 10/24/2016 07:35 AM   Modules accepted: Orders

## 2016-10-25 LAB — HEPATITIS B SURFACE ANTIGEN: HEP B S AG: NONREACTIVE

## 2016-10-25 LAB — HEPATITIS B SURFACE ANTIBODY,QUALITATIVE: HEP B S AB: NONREACTIVE

## 2016-10-27 ENCOUNTER — Encounter: Payer: Self-pay | Admitting: Family Medicine

## 2016-10-28 DIAGNOSIS — M47816 Spondylosis without myelopathy or radiculopathy, lumbar region: Secondary | ICD-10-CM | POA: Diagnosis not present

## 2016-10-28 DIAGNOSIS — M9903 Segmental and somatic dysfunction of lumbar region: Secondary | ICD-10-CM | POA: Diagnosis not present

## 2016-11-07 ENCOUNTER — Encounter: Payer: Self-pay | Admitting: Family Medicine

## 2016-11-11 DIAGNOSIS — M47816 Spondylosis without myelopathy or radiculopathy, lumbar region: Secondary | ICD-10-CM | POA: Diagnosis not present

## 2016-11-11 DIAGNOSIS — M9903 Segmental and somatic dysfunction of lumbar region: Secondary | ICD-10-CM | POA: Diagnosis not present

## 2016-11-25 DIAGNOSIS — M9903 Segmental and somatic dysfunction of lumbar region: Secondary | ICD-10-CM | POA: Diagnosis not present

## 2016-11-25 DIAGNOSIS — M47816 Spondylosis without myelopathy or radiculopathy, lumbar region: Secondary | ICD-10-CM | POA: Diagnosis not present

## 2016-12-09 DIAGNOSIS — M9903 Segmental and somatic dysfunction of lumbar region: Secondary | ICD-10-CM | POA: Diagnosis not present

## 2016-12-09 DIAGNOSIS — M47816 Spondylosis without myelopathy or radiculopathy, lumbar region: Secondary | ICD-10-CM | POA: Diagnosis not present

## 2017-02-03 NOTE — Progress Notes (Signed)
Subjective:   Desiree Spencer is a 76 y.o. female who presents for Medicare Annual (Subsequent) preventive examination.  Diet Trig 118 Starts eating 1/2 a biscuit and 1/2 at lunch Eats a light supper Cholesterol has been high DR. Todd noted a long time ago   Dental no issues  Exercise HDL 60 Does walk around [place] several days a week Functionally can do everything she did last year   BMI - 26 There are no preventive care reminders to display for this patient.   Colonoscopy 07/2008 Mammogram 10/2016 Dexa 82017  -2.4 in the spine Has had 2 dexa;   Psychosocial:  Mother had hd and was overweight; lived to 52 Dad died at 4 Lives with spouse  2 children; one in [place]; [place]  Father died of CHF and had been very healthy all of his life Died at 65 Her mother died of brain aneurysm (her mother and oldest brother died of the same;) lived to 67    Objective:     Vitals: BP 130/80   Pulse 74   Ht 5\' 5"  (1.651 m)   Wt 157 lb 8 oz (71.4 kg)   SpO2 94%   BMI 26.21 kg/m   Body mass index is 26.21 kg/m.   Takes her BP at home  Is generally good Did have to increase her K   Advanced Directives 02/04/2017 10/13/2015  Does Patient Have a Medical Advance Directive? Yes Yes  Copy of [place] in Chart? - No - copy requested    Tobacco Social History   Tobacco Use  Smoking Status Never Smoker  Smokeless Tobacco Never Used     Counseling given: Yes    Past Medical History:  Diagnosis Date  . Acute cystitis 07/22/2007  . ALLERGIC RHINITIS 08/14/2006  . [place] DISEASE, LUMBAR 08/07/2009  . DIVERTICULITIS, ACUTE 06/07/2008  . Extrinsic asthma, unspecified 03/30/2007  . GERD 08/14/2006  . HEPATITIS B, HX OF 08/14/2006  . HYPERLIPIDEMIA 07/01/2008  . HYPERTENSION 08/14/2006  . OSTEOARTHRITIS 08/14/2006  . PILAR CYST 07/20/2008  . ROTATOR CUFF INJURY, RIGHT SHOULDER 11/11/2008   Past Surgical History:  Procedure Laterality Date  . ABDOMINAL HYSTERECTOMY      . childbirth     X 2  . CHOLECYSTECTOMY     Family History  Problem Relation Age of Onset  . Hyperlipidemia Other   . Hypertension Other    Social History   Socioeconomic History  . Marital status: Married    Spouse name: Not on file  . Number of children: Not on file  . Years of education: Not on file  . Highest education level: Not on file  Social Needs  . Financial resource strain: Not on file  . Food insecurity - worry: Not on file  . Food insecurity - inability: Not on file  . Transportation needs - medical: Not on file  . Transportation needs - non-medical: Not on file  Occupational History  . Not on file  Tobacco Use  . Smoking status: Never Smoker  . Smokeless tobacco: Never Used  Substance and Sexual Activity  . Alcohol use: No    Frequency: Never  . Drug use: Not on file  . Sexual activity: Not on file  Other Topics Concern  . Not on file  Social History Narrative  . Not on file    Outpatient Encounter Medications as of 02/04/2017  Medication Sig  . aspirin 81 MG tablet Take 81 mg by mouth daily.    Marland Kitchen  Calcium-Magnesium-Vitamin D (CALCIUM 500 PO) Take by mouth.  . fish oil-omega-3 fatty acids 1000 MG capsule Take 1 g by mouth daily.    . fluticasone (FLONASE) 50 MCG/ACT nasal spray Place into both nostrils daily.  . hydrochlorothiazide (HYDRODIURIL) 25 MG tablet Take 1 tablet (25 mg total) by mouth daily.  . magnesium oxide (MAG-OX) 400 MG tablet Take 400 mg by mouth daily.  . Melatonin 5 MG TABS Take by mouth.  . Potassium 99 MG TABS Take by mouth.  . ranitidine (ZANTAC) 150 MG tablet Take 150 mg by mouth daily.   No facility-administered encounter medications on file as of 02/04/2017.     Activities of Daily Living In your present state of health, do you have any difficulty performing the following activities: 02/04/2017  Hearing? N  Vision? N  Difficulty concentrating or making decisions? N  Walking or climbing stairs? N  Dressing or bathing?  N  Doing errands, shopping? N  Preparing Food and eating ? N  Using the Toilet? N  In the past six months, have you accidently leaked urine? N  Do you have problems with loss of bowel control? N  Managing your Medications? N  Managing your Finances? N  Housekeeping or managing your Housekeeping? N  Some recent data might be hidden    Patient Care Team: Martinique, Jacqulyne G, MD as PCP - General (Family Medicine)    Assessment:   This is a routine wellness examination for Desiree Spencer.  Exercise Activities and Dietary recommendations  may fup at the new [name] being built near her   Goals    . Patient Stated     Work with your stretch band  May check out the new [employer]   Will review the osteoporosis foundation        Fall Risk Fall Risk  02/04/2017 12/18/2015 10/13/2015 11/21/2014 09/27/2013  Falls in the past year? No No No No No    HOME SAFETY Long term plan reviewed  Has a basement;  Home: multi- level; barriers; or needs identified as bathroom railing or other review; 2 level's; plans to age in place  (has a retirement facility building next door) Discussed moving in there if needed. This will be finished in Sept That is an option if needed  Bedrooms and bathrooms downstairs;  Has separate shower   Fall hx; no     Depression Screen PHQ 2/9 Scores 02/04/2017 12/18/2015 10/13/2015 11/21/2014  PHQ - 2 Score 0 0 0 0     Cognitive Function MMSE - Mini Mental State Exam 10/13/2015  Not completed: (No Data)    no issues at this time     Immunization History  Administered Date(s) Administered  . Influenza Split 10/19/2013  . Influenza Whole 02/18/2006, 09/27/2008  . Influenza, High Dose Seasonal PF 11/21/2014, 11/09/2015, 10/23/2016  . Pneumococcal Conjugate-13 09/27/2013  . Pneumococcal Polysaccharide-23 02/18/2006  . Td 06/10/2006  . Tdap 12/18/2015  . Zoster 08/31/2007    Qualifies for Shingles Vaccine? Zoster 08/2007 Shingrix is a vaccine for  the prevention of Shingles in Adults 50 and older.  If you are on Medicare, you can request a prescription from your doctor to be filled at a pharmacy.  Please check with your benefits regarding applicable copays or out of pocket expenses.   The Shingrix is given in 2 vaccines approx 8 weeks apart. You must receive the 2nd dose prior to 6 months from receipt of the first.    Screening Tests Health Maintenance  Topic Date Due  . MAMMOGRAM  10/22/2017  . TETANUS/TDAP  12/17/2025  . INFLUENZA VACCINE  Completed  . DEXA SCAN  Completed  . PNA vac Low Risk Adult  Completed    There are no preventive care reminders to display for this patient.        Plan:     PCP Notes   Health Maintenance Transitioned in housing; no issues   [employer] going up and will visit to see if she wants to join. Recommended due to osteopenia; vertebra -2.4  Reviewed the importance of weight bearing exercise Also given exercises to use with stretch band at home which she has  Discussed reducing cholesterol in diet; does like sweets   Educated regarding the shingrix   Abnormal Screens  No; BMI 26   Referrals  None, but will check her insurance coverage for a repeat dexa and discuss with dr. Martinique at her next visit   Patient concerns; None; doing well  Nurse Concerns; As noted   Next PCP apt 10/24/2017       I have personally reviewed and noted the following in the patient's chart:   . Medical and social history . Use of alcohol, tobacco or illicit drugs  . Current medications and supplements . Functional ability and status . Nutritional status . Physical activity . Advanced directives . List of other physicians . Hospitalizations, surgeries, and ER visits in previous 12 months . Vitals . Screenings to include cognitive, depression, and falls . Referrals and appointments  In addition, I have reviewed and discussed with patient certain preventive protocols, quality  metrics, and best practice recommendations. A written personalized care plan for preventive services as well as general preventive health recommendations were provided to patient.     Wynetta Fines, RN  02/04/2017

## 2017-02-04 ENCOUNTER — Ambulatory Visit (INDEPENDENT_AMBULATORY_CARE_PROVIDER_SITE_OTHER): Payer: Medicare Other

## 2017-02-04 VITALS — BP 130/80 | HR 74 | Ht 65.0 in | Wt 157.5 lb

## 2017-02-04 DIAGNOSIS — Z0000 Encounter for general adult medical examination without abnormal findings: Secondary | ICD-10-CM | POA: Diagnosis not present

## 2017-02-04 NOTE — Patient Instructions (Addendum)
Desiree Spencer , Thank you for taking time to come for your Medicare Wellness Visit. I appreciate your ongoing commitment to your health goals. Please review the following plan we discussed and let me know if I can assist you in the future.   To avoid further bone loss from osteopenia; try to take 1200mg  of calcium and at least 1000 u of Vit E every day. Try to get at least 30 minutes of weight bearing exercise 5 days a week which includes walking, water aerobics; weight lifting (go low and slow), yoga, dancing etc.  Osteoporosis foundation. Org   In Sept; follow up with your Bone density with Dr. Martinique   Shingrix is a vaccine for the prevention of Shingles in Adults 50 and older.  If you are on Medicare, you can request a prescription from your doctor to be filled at a pharmacy.  Please check with your benefits regarding applicable copays or out of pocket expenses.  The Shingrix is given in 2 vaccines approx 8 weeks apart. You must receive the 2nd dose prior to 6 months from receipt of the first.   These are the goals we discussed: Goals    . Patient Stated     Work with your stretch band  May check out the [employer]   Will review the osteoporosis foundation        This is a list of the screening recommended for you and due dates:  Health Maintenance  Topic Date Due  . Mammogram  10/22/2017  . Tetanus Vaccine  12/17/2025  . Flu Shot  Completed  . DEXA scan (bone density measurement)  Completed  . Pneumonia vaccines  Completed   Summary: Preventive Care for Adults  A healthy lifestyle and preventive care can promote health and wellness. Preventive health guidelines for adults include the following key practices.  . A routine yearly physical is a good way to check with your health care provider about your health and preventive screening. It is a chance to share any concerns and updates on your health and to receive a thorough exam.  . Visit your dentist for a  routine exam and preventive care every 6 months. Brush your teeth twice a day and floss once a day. Good oral hygiene prevents tooth decay and gum disease.  . The frequency of eye exams is based on your age, health, family medical history, use  of contact lenses, and other factors. Follow your health care provider's ecommendations for frequency of eye exams.  . Eat a healthy diet. Foods like vegetables, fruits, whole grains, low-fat dairy products, and lean protein foods contain the nutrients you need without too many calories. Decrease your intake of foods high in solid fats, added sugars, and salt. Eat the right amount of calories for you. Get information about a proper diet from your health care provider, if necessary.  . Regular physical exercise is one of the most important things you can do for your health. Most adults should get at least 150 minutes of moderate-intensity exercise (any activity that increases your heart rate and causes you to sweat) each week. In addition, most adults need muscle-strengthening exercises on 2 or more days a week.  Silver Sneakers may be a benefit available to you. To determine eligibility, you may visit the website: www.silversneakers.com or contact program at 562 244 5088 Mon-Fri between 8AM-8PM.   . Maintain a healthy weight. The body mass index (BMI) is a screening tool to identify possible weight problems. It provides  an estimate of body fat based on height and weight. Your health care provider can find your BMI and can help you achieve or maintain a healthy weight.   For adults 20 years and older: ? A BMI below 18.5 is considered underweight. ? A BMI of 18.5 to 24.9 is normal. ? A BMI of 27 to 28 is considered normal by the Institutes of Health  ? A BMI of 30 and above is considered obese.   . Maintain normal blood lipids and cholesterol levels by exercising and minimizing your intake of saturated fat. Eat a balanced diet with plenty of fruit  and vegetables. Blood tests for lipids and cholesterol should begin at age 58 and be repeated every 5 years. If your lipid or cholesterol levels are high, you are over 50, or you are at high risk for heart disease, you may need your cholesterol levels checked more frequently. Ongoing high lipid and cholesterol levels should be treated with medicines if diet and exercise are not working.  . If you smoke, find out from your health care provider how to quit. If you do not use tobacco, please do not start.  . If you choose to drink alcohol, please do not consume more than one drink for women and 2 for men.  One drink is considered to be 12 ounces (355 mL) of beer, 5 ounces (148 mL) of wine, or 1.5 ounces (44 mL) of liquor. Moderation of alcohol intake to this level decreases your risk of breast cancer and liver damage.   . If you are 23-37 years old, ask your health care provider if you should take aspirin to prevent strokes.  . Use sunscreen. Apply sunscreen liberally and repeatedly throughout the day. You should seek shade when your shadow is shorter than you. Protect yourself by wearing long sleeves, pants, a wide-brimmed hat, and sunglasses year round, whenever you are outdoors.  . Once a month, do a whole body skin exam, using a mirror to look at the skin on your back. Tell your health care provider of new moles, moles that have irregular borders, moles that are larger than a pencil eraser, or moles that have changed in shape or color.  Last, if you have completed an Advanced Directive; please bring a copy and review with your physician and then we will scan to the medical record      Fall Prevention in the Home Falls can cause injuries. They can happen to people of all ages. There are many things you can do to make your home safe and to help prevent falls. What can I do on the outside of my home?  Regularly fix the edges of walkways and driveways and fix any cracks.  Remove anything  that might make you trip as you walk through a door, such as a raised step or threshold.  Trim any bushes or trees on the path to your home.  Use bright outdoor lighting.  Clear any walking paths of anything that might make someone trip, such as rocks or tools.  Regularly check to see if handrails are loose or broken. Make sure that both sides of any steps have handrails.  Any raised decks and porches should have guardrails on the edges.  Have any leaves, snow, or ice cleared regularly.  Use sand or salt on walking paths during winter.  Clean up any spills in your garage right away. This includes oil or grease spills. What can I do in the bathroom?  Use  night lights.  Install grab bars by the toilet and in the tub and shower. Do not use towel bars as grab bars.  Use non-skid mats or decals in the tub or shower.  If you need to sit down in the shower, use a plastic, non-slip stool.  Keep the floor dry. Clean up any water that spills on the floor as soon as it happens.  Remove soap buildup in the tub or shower regularly.  Attach bath mats securely with double-sided non-slip rug tape.  Do not have throw rugs and other things on the floor that can make you trip. What can I do in the bedroom?  Use night lights.  Make sure that you have a light by your bed that is easy to reach.  Do not use any sheets or blankets that are too big for your bed. They should not hang down onto the floor.  Have a firm chair that has side arms. You can use this for support while you get dressed.  Do not have throw rugs and other things on the floor that can make you trip. What can I do in the kitchen?  Clean up any spills right away.  Avoid walking on wet floors.  Keep items that you use a lot in easy-to-reach places.  If you need to reach something above you, use a strong step stool that has a grab bar.  Keep electrical cords out of the way.  Do not use floor polish or wax that makes  floors slippery. If you must use wax, use non-skid floor wax.  Do not have throw rugs and other things on the floor that can make you trip. What can I do with my stairs?  Do not leave any items on the stairs.  Make sure that there are handrails on both sides of the stairs and use them. Fix handrails that are broken or loose. Make sure that handrails are as long as the stairways.  Check any carpeting to make sure that it is firmly attached to the stairs. Fix any carpet that is loose or worn.  Avoid having throw rugs at the top or bottom of the stairs. If you do have throw rugs, attach them to the floor with carpet tape.  Make sure that you have a light switch at the top of the stairs and the bottom of the stairs. If you do not have them, ask someone to add them for you. What else can I do to help prevent falls?  Wear shoes that: ? Do not have high heels. ? Have rubber bottoms. ? Are comfortable and fit you well. ? Are closed at the toe. Do not wear sandals.  If you use a stepladder: ? Make sure that it is fully opened. Do not climb a closed stepladder. ? Make sure that both sides of the stepladder are locked into place. ? Ask someone to hold it for you, if possible.  Clearly mark and make sure that you can see: ? Any grab bars or handrails. ? First and last steps. ? Where the edge of each step is.  Use tools that help you move around (mobility aids) if they are needed. These include: ? Canes. ? Walkers. ? Scooters. ? Crutches.  Turn on the lights when you go into a dark area. Replace any light bulbs as soon as they burn out.  Set up your furniture so you have a clear path. Avoid moving your furniture around.  If any of your  floors are uneven, fix them.  If there are any pets around you, be aware of where they are.  Review your medicines with your doctor. Some medicines can make you feel dizzy. This can increase your chance of falling. Ask your doctor what other things  that you can do to help prevent falls. This information is not intended to replace advice given to you by your health care provider. Make sure you discuss any questions you have with your health care provider. Document Released: 12/01/2008 Document Revised: 07/13/2015 Document Reviewed: 03/11/2014 Elsevier Interactive Patient Education  [employer].

## 2017-02-04 NOTE — Progress Notes (Signed)
I have reviewed documentation from this visit and I agree with recommendations given.  Doneisha G. Halima Fogal, MD  [facility]. [place] office.   

## 2017-02-21 ENCOUNTER — Encounter: Payer: Self-pay | Admitting: Family Medicine

## 2017-02-21 ENCOUNTER — Ambulatory Visit (INDEPENDENT_AMBULATORY_CARE_PROVIDER_SITE_OTHER): Payer: Medicare Other | Admitting: Family Medicine

## 2017-02-21 VITALS — BP 136/74 | HR 98 | Temp 98.1°F | Wt 159.0 lb

## 2017-02-21 DIAGNOSIS — R05 Cough: Secondary | ICD-10-CM | POA: Diagnosis not present

## 2017-02-21 DIAGNOSIS — R059 Cough, unspecified: Secondary | ICD-10-CM

## 2017-02-21 MED ORDER — HYDROCODONE-HOMATROPINE 5-1.5 MG/5ML PO SYRP: 5.0000 mL | ORAL_SOLUTION | Freq: Four times a day (QID) | ORAL | 0 refills | Status: AC | PRN

## 2017-02-21 NOTE — Patient Instructions (Signed)

## 2017-02-21 NOTE — Progress Notes (Signed)
Subjective:     Patient ID: Desiree Spencer, female   DOB: 1940-11-04, 77 y.o.   MRN: [id number]  HPI Patient is a nonsmoker who seen with chief complaint of cough. Onset yesterday but fairly severe at times. No fever. No chills. Minimal nasal congestion. Denies any wheezing, shortness of breath, chest pains, pleuritic pain. Has not taken any medications thus far. Denies recent appetite or weight change.  Past Medical History:  Diagnosis Date  . Acute cystitis 07/22/2007  . ALLERGIC RHINITIS 08/14/2006  . [place] DISEASE, LUMBAR 08/07/2009  . DIVERTICULITIS, ACUTE 06/07/2008  . Extrinsic asthma, unspecified 03/30/2007  . GERD 08/14/2006  . HEPATITIS B, HX OF 08/14/2006  . HYPERLIPIDEMIA 07/01/2008  . HYPERTENSION 08/14/2006  . OSTEOARTHRITIS 08/14/2006  . PILAR CYST 07/20/2008  . ROTATOR CUFF INJURY, RIGHT SHOULDER 11/11/2008   Past Surgical History:  Procedure Laterality Date  . ABDOMINAL HYSTERECTOMY    . childbirth     X 2  . CHOLECYSTECTOMY      reports that  has never smoked. she has never used smokeless tobacco. She reports that she does not drink alcohol. Her drug history is not on file. family history includes Anuerysm (age of onset: 65) in her mother; Cerebral aneurysm in her brother; Heart failure (age of onset: 72) in her father; Hyperlipidemia in her other; Hypertension in her other. No Known Allergies   Review of Systems  Constitutional: Negative for appetite change, chills, fever and unexpected weight change.  HENT: Positive for congestion.   Respiratory: Positive for cough. Negative for shortness of breath and wheezing.   Cardiovascular: Negative for chest pain.       Objective:   Physical Exam  Constitutional: She appears well-developed and well-nourished.  HENT:  Right Ear: External ear normal.  Left Ear: External ear normal.  Mouth/Throat: Oropharynx is clear and moist.  Neck: Neck supple.  Cardiovascular: Normal rate and regular rhythm.  Pulmonary/Chest: Effort  normal and breath sounds normal. No respiratory distress. She has no wheezes. She has no rales.  Lymphadenopathy:    She has no cervical adenopathy.       Assessment:     Cough. Suspect acute viral bronchitis     Plan:     -Limited Hycodan cough syrup 1 teaspoon daily at bedtime when necessary severe cough-if not relieved with over-the-counter medications -Follow-up promptly for any fever or increasing shortness of breath  Eulas Post MD [facility] at [facility]

## 2017-05-26 ENCOUNTER — Encounter: Payer: Self-pay | Admitting: Family Medicine

## 2017-05-26 ENCOUNTER — Ambulatory Visit (INDEPENDENT_AMBULATORY_CARE_PROVIDER_SITE_OTHER): Payer: Medicare Other | Admitting: Family Medicine

## 2017-05-26 VITALS — BP 130/86 | HR 96 | Temp 98.2°F | Resp 12 | Ht 65.0 in | Wt 158.1 lb

## 2017-05-26 DIAGNOSIS — R300 Dysuria: Secondary | ICD-10-CM

## 2017-05-26 DIAGNOSIS — R3915 Urgency of urination: Secondary | ICD-10-CM | POA: Diagnosis not present

## 2017-05-26 DIAGNOSIS — N390 Urinary tract infection, site not specified: Secondary | ICD-10-CM | POA: Diagnosis not present

## 2017-05-26 LAB — POCT URINALYSIS DIPSTICK
BILIRUBIN UA: NEGATIVE
Glucose, UA: NEGATIVE
KETONES UA: NEGATIVE
Nitrite, UA: NEGATIVE
ODOR: NEGATIVE
PH UA: 6.5 (ref 5.0–8.0)
Spec Grav, UA: 1.02 (ref 1.010–1.025)
UROBILINOGEN UA: 0.2 U/dL

## 2017-05-26 MED ORDER — SULFAMETHOXAZOLE-TRIMETHOPRIM 800-160 MG PO TABS: 1.0000 | ORAL_TABLET | Freq: Two times a day (BID) | ORAL | 0 refills | Status: DC

## 2017-05-26 NOTE — Patient Instructions (Signed)
A few things to remember from today's visit:   Urinary urgency - Plan: POC Urinalysis Dipstick, Urine culture  Dysuria - Plan: POC Urinalysis Dipstick, Urine culture  Urinary tract infection without hematuria, site unspecified - Plan: sulfamethoxazole-trimethoprim (BACTRIM DS,SEPTRA DS) 800-160 MG tablet, Urine culture    Adequate fluid intake, avoid holding urine for long hours, and over the counter Vit C OR cranberry capsules might help.  Today we will treat empirically with antibiotic, which we might need to change when urine culture comes back depending of bacteria susceptibility.  Seek immediate medical attention if severe abdominal pain, vomiting, fever/chills, or worsening symptoms. F/U if symptomatic are not any better after 2-3 days of antibiotic treatment.  Please be sure medication list is accurate. If a new problem present, please set up appointment sooner than planned today.

## 2017-05-26 NOTE — Progress Notes (Signed)
HPI:   [name] is a 77 y.o. female, who is here today complaining of 3 days of urinary symptoms.  Dysuria: Burning sensation with voiding. Urinary frequency: No more than usual. Urinary urgency: Yes Incontinence: Not present. Gross hematuria: Not present.  Abdominal pain: Discomfort in the suprapubic area, which she describes as "upset" feeling, intermittently.  Nausea or vomiting: Not present.  Abnormal vaginal bleeding or discharge: Denies She has no noted fever, chills, changes in bowel habits, or back pain.  LMP: Postmenopausal Sexual activity: Denies Hx of UTI: Similar symptoms in 06/2016.  Urine culture did not meet criteria for UTI.  OTC medications for this problem: None.   Review of Systems  Constitutional: Negative for activity change, appetite change, fatigue and fever.  Respiratory: Negative for shortness of breath.   Cardiovascular: Negative for leg swelling.  Gastrointestinal: Positive for abdominal pain. Negative for nausea and vomiting.       No changes in bowel habits.  Genitourinary: Positive for dysuria and urgency. Negative for decreased urine volume, frequency, hematuria, vaginal bleeding and vaginal discharge.  Musculoskeletal: Negative for back pain and myalgias.  Neurological: Negative for syncope and weakness.      Current Outpatient Medications on File Prior to Visit  Medication Sig Dispense Refill  . aspirin 81 MG tablet Take 81 mg by mouth daily.      . Calcium-Magnesium-Vitamin D (CALCIUM 500 PO) Take by mouth.    . fish oil-omega-3 fatty acids 1000 MG capsule Take 1 g by mouth daily.      . fluticasone (FLONASE) 50 MCG/ACT nasal spray Place into both nostrils daily.    . hydrochlorothiazide (HYDRODIURIL) 25 MG tablet Take 1 tablet (25 mg total) by mouth daily. 90 tablet 3  . magnesium oxide (MAG-OX) 400 MG tablet Take 400 mg by mouth daily.    . Melatonin 5 MG TABS Take by mouth.    . Potassium 99 MG TABS Take by mouth.     . ranitidine (ZANTAC) 150 MG tablet Take 150 mg by mouth daily.     No current facility-administered medications on file prior to visit.      Past Medical History:  Diagnosis Date  . Acute cystitis 07/22/2007  . ALLERGIC RHINITIS 08/14/2006  . [place] DISEASE, LUMBAR 08/07/2009  . DIVERTICULITIS, ACUTE 06/07/2008  . Extrinsic asthma, unspecified 03/30/2007  . GERD 08/14/2006  . HEPATITIS B, HX OF 08/14/2006  . HYPERLIPIDEMIA 07/01/2008  . HYPERTENSION 08/14/2006  . OSTEOARTHRITIS 08/14/2006  . PILAR CYST 07/20/2008  . ROTATOR CUFF INJURY, RIGHT SHOULDER 11/11/2008   No Known Allergies  Social History   Socioeconomic History  . Marital status: Married    Spouse name: Not on file  . Number of children: Not on file  . Years of education: Not on file  . Highest education level: Not on file  Occupational History  . Not on file  Social Needs  . Financial resource strain: Not on file  . Food insecurity:    Worry: Not on file    Inability: Not on file  . Transportation needs:    Medical: Not on file    Non-medical: Not on file  Tobacco Use  . Smoking status: Never Smoker  . Smokeless tobacco: Never Used  Substance and Sexual Activity  . Alcohol use: No    Frequency: Never  . Drug use: Not on file  . Sexual activity: Not on file  Lifestyle  . Physical activity:    Days  per week: Not on file    Minutes per session: Not on file  . Stress: Not on file  Relationships  . Social connections:    Talks on phone: Not on file    Gets together: Not on file    Attends religious service: Not on file    Active member of club or organization: Not on file    Attends meetings of clubs or organizations: Not on file    Relationship status: Not on file  Other Topics Concern  . Not on file  Social History Narrative  . Not on file    Vitals:   05/26/17 1455  BP: 130/86  Pulse: 96  Resp: 12  Temp: 98.2 F (36.8 C)  SpO2: 98%   Body mass index is 26.31 kg/m.    Physical Exam    Nursing note and vitals reviewed. Constitutional: She is oriented to person, place, and time. She appears well-developed. No distress.  HENT:  Head: Normocephalic and atraumatic.  Mouth/Throat: Oropharynx is clear and moist and mucous membranes are normal.  Eyes: Conjunctivae are normal.  Cardiovascular: Normal rate and regular rhythm.  Respiratory: Effort normal and breath sounds normal. No respiratory distress.  GI: Soft. She exhibits no mass. There is tenderness in the suprapubic area. There is no rigidity, no rebound, no guarding and no CVA tenderness.  Musculoskeletal: She exhibits no edema.       Lumbar back: She exhibits no tenderness and no bony tenderness.  Lymphadenopathy:    She has no cervical adenopathy.  Neurological: She is alert and oriented to person, place, and time.  Skin: Skin is warm. No rash noted. No erythema.  Psychiatric: She has a normal mood and affect.  Well-groomed, good eye contact.    ASSESSMENT AND PLAN:   [name] for urinary urgency and dysuria.  Diagnoses and all orders for this visit:  Urinary urgency -     POC Urinalysis Dipstick -     Urine culture  Dysuria  Possible etiologies discussed other than UTI. Urine dipstick today abnormal, we will follow Ucx.  -     POC Urinalysis Dipstick -     Urine culture  Urinary tract infection without hematuria, site unspecified  Ucx ordered.   Empiric abx treatment started today and will be tailored according to Ucx results and susceptibility report.  Monitor for worrisome symptoms. F/U if symptoms persist.   -     sulfamethoxazole-trimethoprim (BACTRIM DS,SEPTRA DS) 800-160 MG tablet; Take 1 tablet by mouth 2 (two) times daily. -     Urine culture       Ilisha G. Martinique, MD  [facility]. [place] office.

## 2017-05-27 ENCOUNTER — Ambulatory Visit: Payer: Medicare Other | Admitting: Adult Health

## 2017-05-27 ENCOUNTER — Telehealth: Payer: Self-pay | Admitting: Family Medicine

## 2017-05-27 ENCOUNTER — Ambulatory Visit: Payer: Self-pay | Admitting: *Deleted

## 2017-05-27 ENCOUNTER — Encounter: Payer: Self-pay | Admitting: Family Medicine

## 2017-05-27 ENCOUNTER — Ambulatory Visit (INDEPENDENT_AMBULATORY_CARE_PROVIDER_SITE_OTHER): Payer: Medicare Other | Admitting: Family Medicine

## 2017-05-27 VITALS — BP 124/82 | HR 88 | Temp 97.8°F | Resp 12 | Ht 65.0 in | Wt 159.0 lb

## 2017-05-27 DIAGNOSIS — T783XXA Angioneurotic edema, initial encounter: Secondary | ICD-10-CM | POA: Diagnosis not present

## 2017-05-27 DIAGNOSIS — N390 Urinary tract infection, site not specified: Secondary | ICD-10-CM

## 2017-05-27 LAB — URINE CULTURE
MICRO NUMBER:: 90429780
SPECIMEN QUALITY:: ADEQUATE

## 2017-05-27 MED ORDER — NITROFURANTOIN MONOHYD MACRO 100 MG PO CAPS: 100.0000 mg | ORAL_CAPSULE | Freq: Two times a day (BID) | ORAL | 0 refills | Status: AC

## 2017-05-27 NOTE — Telephone Encounter (Signed)
Pt called with complaints of, her chin is reddened and "a little bit numb"; also the right side of it is swollen; she has taken only 1 dose of the antibiotic she was given yesterday; recommendations made per nurse triage protocol to include seeing a physician within 4 hours; pt normally sees Dr Martinique at [facility]; pt offered and accepted appointment with Dorothyann Peng at 107; the pt verbalizes understanding; spoke with [facility] and she states that the pt should be changed to Dr Martinique at 1100; will contact pt and make her aware of change of provider.    Reason for Disposition . Swelling began after taking a drug  Answer Assessment - Initial Assessment Questions 1. ONSET: "When did the swelling start?" (e.g., minutes, hours, days)     hours 2. LOCATION: "What part of the face is swollen?"     chin 3. SEVERITY: "How swollen is it?"     Moderate swelling  4. ITCHING: "Is there any itching?" If so, ask: "How much?"   (Scale 1-10; mild, moderate or severe)     no 5. PAIN: "Is the swelling painful to touch?" If so, ask: "How painful is it?"   (Scale 1-10; mild, moderate or severe)     no 6. FEVER: "Do you have a fever?" If so, ask: "What is it, how was it measured, and when did it start?"      no 7. CAUSE: "What do you think is causing the face swelling?"     Started antibiotic yesterday 8. RECURRENT SYMPTOM: "Have you had face swelling before?" If so, ask: "When was the last time?" "What happened that time?"     no 9. OTHER SYMPTOMS: "Do you have any other symptoms?" (e.g., toothache, leg swelling)     no 10. PREGNANCY: "Is there any chance you are pregnant?" "When was your last menstrual period?"       no  Protocols used: [facility]

## 2017-05-27 NOTE — Telephone Encounter (Signed)
Contacted pt and made her aware of appointment change to Dr Martinique at 1100; she verbalizes understanding.

## 2017-05-27 NOTE — Progress Notes (Signed)
ACUTE VISIT   HPI:  Chief Complaint  Patient presents with  . Rash    on chin, started after taking antibiotic last night    [name] is a 77 y.o. female, who is here today complaining of allergic reaction a day after she started Bactrim for UTI symptoms.  I saw her on 05/26/2017, when she was complaining of dysuria and urinary urgency.  She was started on empiric antibiotic treatment, Bactrim DS twice daily for 3 days.  Dysuria has improved.  She took the first dose of Bactrim last night and this morning she woke up with lower lip edema and erythema. She has taken Bactrim before with no problems.  She denies odynophagia or dysphagia, cough, wheezing, dyspnea, nausea, vomiting, diarrhea. She has not taking OTC medication, she is on Zyrtec 10 mg daily for allergies in general. Edema and erythema is slowly getting better.  Urinary symptoms have improved slightly but still having dysuria. Urine culture is pending.   Review of Systems  Constitutional: Negative for appetite change, chills, fatigue and fever.  HENT: Negative for mouth sores, sore throat, trouble swallowing and voice change.   Eyes: Negative for discharge and redness.  Respiratory: Negative for cough, shortness of breath and wheezing.   Cardiovascular: Negative for chest pain, palpitations and leg swelling.  Gastrointestinal: Negative for abdominal pain, diarrhea, nausea and vomiting.  Genitourinary: Positive for dysuria and urgency. Negative for decreased urine volume and hematuria.  Skin: Positive for rash. Negative for wound.  Neurological: Negative for syncope, weakness and headaches.      Current Outpatient Medications on File Prior to Visit  Medication Sig Dispense Refill  . Calcium-Magnesium-Vitamin D (CALCIUM 500 PO) Take by mouth.    . fish oil-omega-3 fatty acids 1000 MG capsule Take 1 g by mouth daily.      . fluticasone (FLONASE) 50 MCG/ACT nasal spray Place into both nostrils  daily.    . hydrochlorothiazide (HYDRODIURIL) 25 MG tablet Take 1 tablet (25 mg total) by mouth daily. 90 tablet 3  . magnesium oxide (MAG-OX) 400 MG tablet Take 400 mg by mouth daily.    . Melatonin 5 MG TABS Take by mouth.    Marland Kitchen POTASSIUM PO Take 40 mg by mouth.     . ranitidine (ZANTAC) 150 MG tablet Take 150 mg by mouth daily.     No current facility-administered medications on file prior to visit.      Past Medical History:  Diagnosis Date  . Acute cystitis 07/22/2007  . ALLERGIC RHINITIS 08/14/2006  . [place] DISEASE, LUMBAR 08/07/2009  . DIVERTICULITIS, ACUTE 06/07/2008  . Extrinsic asthma, unspecified 03/30/2007  . GERD 08/14/2006  . HEPATITIS B, HX OF 08/14/2006  . HYPERLIPIDEMIA 07/01/2008  . HYPERTENSION 08/14/2006  . OSTEOARTHRITIS 08/14/2006  . PILAR CYST 07/20/2008  . ROTATOR CUFF INJURY, RIGHT SHOULDER 11/11/2008   Allergies  Allergen Reactions  . Bactrim [Sulfamethoxazole-Trimethoprim] Swelling    Social History   Socioeconomic History  . Marital status: Married    Spouse name: Not on file  . Number of children: Not on file  . Years of education: Not on file  . Highest education level: Not on file  Occupational History  . Not on file  Social Needs  . Financial resource strain: Not on file  . Food insecurity:    Worry: Not on file    Inability: Not on file  . Transportation needs:    Medical: Not on file  Non-medical: Not on file  Tobacco Use  . Smoking status: Never Smoker  . Smokeless tobacco: Never Used  Substance and Sexual Activity  . Alcohol use: No    Frequency: Never  . Drug use: Not on file  . Sexual activity: Not on file  Lifestyle  . Physical activity:    Days per week: Not on file    Minutes per session: Not on file  . Stress: Not on file  Relationships  . Social connections:    Talks on phone: Not on file    Gets together: Not on file    Attends religious service: Not on file    Active member of club or organization: Not on file     Attends meetings of clubs or organizations: Not on file    Relationship status: Not on file  Other Topics Concern  . Not on file  Social History Narrative  . Not on file    Vitals:   05/27/17 1050  BP: 124/82  Pulse: 88  Resp: 12  Temp: 97.8 F (36.6 C)  SpO2: 96%   Body mass index is 26.46 kg/m.   Physical Exam  Nursing note and vitals reviewed. Constitutional: She is oriented to person, place, and time. She appears well-developed and well-nourished. No distress.  HENT:  Head: Normocephalic and atraumatic.  Mouth/Throat: Oropharynx is clear and moist and mucous membranes are normal.  Mild lower lip edema.   Eyes: Conjunctivae are normal.  Respiratory: Effort normal and breath sounds normal. No stridor. No respiratory distress.  Musculoskeletal: She exhibits no edema.  Lymphadenopathy:    She has no cervical adenopathy.  Neurological: She is alert and oriented to person, place, and time. She has normal strength. Gait normal.  Skin: Skin is warm. Rash noted.  Psychiatric: She has a normal mood and affect. Her speech is normal.  Well groomed, good eye contact.    ASSESSMENT AND PLAN:    [name] for rash.  Diagnoses and all orders for this visit:  Angioedema of lips, initial encounter  She is not interested in oral or IM steroids, it has improved. She will continue Zyrtec 10 mg bid. Monitor for warning signs.  Urinary tract infection without hematuria, site unspecified  Ucx pending. Macrobid recommended, will tailor according to Ucx results.   -     nitrofurantoin, macrocrystal-monohydrate, (MACROBID) 100 MG capsule; Take 1 capsule (100 mg total) by mouth 2 (two) times daily for 5 days.     -[name] was advised to seek immediate medical attention if sudden worsening symptoms or to follow if they persist or if new concerns arise.       Casandra G. Martinique, MD  [facility]. [place]  office.

## 2017-05-30 ENCOUNTER — Encounter: Payer: Self-pay | Admitting: Family Medicine

## 2017-07-31 DIAGNOSIS — H524 Presbyopia: Secondary | ICD-10-CM | POA: Diagnosis not present

## 2017-07-31 DIAGNOSIS — H2513 Age-related nuclear cataract, bilateral: Secondary | ICD-10-CM | POA: Diagnosis not present

## 2017-08-13 DIAGNOSIS — N390 Urinary tract infection, site not specified: Secondary | ICD-10-CM | POA: Diagnosis not present

## 2017-08-13 DIAGNOSIS — R3915 Urgency of urination: Secondary | ICD-10-CM | POA: Diagnosis not present

## 2017-08-13 DIAGNOSIS — R350 Frequency of micturition: Secondary | ICD-10-CM | POA: Diagnosis not present

## 2017-08-13 DIAGNOSIS — Z6826 Body mass index (BMI) 26.0-26.9, adult: Secondary | ICD-10-CM | POA: Diagnosis not present

## 2017-08-13 DIAGNOSIS — R300 Dysuria: Secondary | ICD-10-CM | POA: Diagnosis not present

## 2017-08-19 ENCOUNTER — Encounter: Payer: Self-pay | Admitting: Family Medicine

## 2017-08-19 ENCOUNTER — Ambulatory Visit (INDEPENDENT_AMBULATORY_CARE_PROVIDER_SITE_OTHER): Payer: Medicare Other | Admitting: Family Medicine

## 2017-08-19 VITALS — BP 120/70 | HR 78 | Temp 97.9°F | Resp 12 | Ht 65.0 in | Wt 162.1 lb

## 2017-08-19 DIAGNOSIS — R238 Other skin changes: Secondary | ICD-10-CM

## 2017-08-19 DIAGNOSIS — R600 Localized edema: Secondary | ICD-10-CM | POA: Diagnosis not present

## 2017-08-19 MED ORDER — ACYCLOVIR 5 % EX OINT: 1.0000 "application " | TOPICAL_OINTMENT | Freq: Four times a day (QID) | CUTANEOUS | 0 refills | Status: AC

## 2017-08-19 NOTE — Progress Notes (Signed)
ACUTE VISIT   HPI:  Chief Complaint  Patient presents with  . Lip infection    started after taking septra last week for a bladder infection    Ms.LATESSA TILLIS is a 77 y.o. female, who is here today complaining of lower lip edema and tenderness that she noted after starting Bactrim for UTI. Vesicular lesions that bursted. She has had same lesion in the past when she was on Bactrim, this time she completed treatment because she did not want to go back to the ER and she was out of town. She has not noted lesions in mouth, stridor, dysphagia, odynophagia, cough, wheezing, or dyspnea. It seems to be improving after she completed treatment with Bactrim, 2 days ago. No skin rash elsewhere.  OTC topical treatments have not helped.     Review of Systems  Constitutional: Negative for appetite change, chills, fatigue and fever.  HENT: Positive for mouth sores. Negative for sore throat, trouble swallowing and voice change.   Eyes: Negative for redness and visual disturbance.  Respiratory: Negative for cough, shortness of breath and wheezing.   Skin: Positive for rash. Negative for wound.  Neurological: Negative for weakness and headaches.  Hematological: Negative for adenopathy. Does not bruise/bleed easily.      Current Outpatient Medications on File Prior to Visit  Medication Sig Dispense Refill  . Calcium-Magnesium-Vitamin D (CALCIUM 500 PO) Take by mouth.    . fish oil-omega-3 fatty acids 1000 MG capsule Take 1 g by mouth daily.      . fluticasone (FLONASE) 50 MCG/ACT nasal spray Place into both nostrils daily.    . hydrochlorothiazide (HYDRODIURIL) 25 MG tablet Take 1 tablet (25 mg total) by mouth daily. 90 tablet 3  . magnesium oxide (MAG-OX) 400 MG tablet Take 400 mg by mouth daily.    . Melatonin 5 MG TABS Take by mouth.    Marland Kitchen POTASSIUM PO Take 40 mg by mouth.     . ranitidine (ZANTAC) 150 MG tablet Take 150 mg by mouth daily.     No current  facility-administered medications on file prior to visit.      Past Medical History:  Diagnosis Date  . Acute cystitis 07/22/2007  . ALLERGIC RHINITIS 08/14/2006  . [place] DISEASE, LUMBAR 08/07/2009  . DIVERTICULITIS, ACUTE 06/07/2008  . Extrinsic asthma, unspecified 03/30/2007  . GERD 08/14/2006  . HEPATITIS B, HX OF 08/14/2006  . HYPERLIPIDEMIA 07/01/2008  . HYPERTENSION 08/14/2006  . OSTEOARTHRITIS 08/14/2006  . PILAR CYST 07/20/2008  . ROTATOR CUFF INJURY, RIGHT SHOULDER 11/11/2008   Allergies  Allergen Reactions  . Bactrim [Sulfamethoxazole-Trimethoprim] Swelling    Social History   Socioeconomic History  . Marital status: Married    Spouse name: Not on file  . Number of children: Not on file  . Years of education: Not on file  . Highest education level: Not on file  Occupational History  . Not on file  Social Needs  . Financial resource strain: Not on file  . Food insecurity:    Worry: Not on file    Inability: Not on file  . Transportation needs:    Medical: Not on file    Non-medical: Not on file  Tobacco Use  . Smoking status: Never Smoker  . Smokeless tobacco: Never Used  Substance and Sexual Activity  . Alcohol use: No    Frequency: Never  . Drug use: Not on file  . Sexual activity: Not on file  Lifestyle  .  Physical activity:    Days per week: Not on file    Minutes per session: Not on file  . Stress: Not on file  Relationships  . Social connections:    Talks on phone: Not on file    Gets together: Not on file    Attends religious service: Not on file    Active member of club or organization: Not on file    Attends meetings of clubs or organizations: Not on file    Relationship status: Not on file  Other Topics Concern  . Not on file  Social History Narrative  . Not on file    Vitals:   08/19/17 1454  BP: 120/70  Pulse: 78  Resp: 12  Temp: 97.9 F (36.6 C)  SpO2: 95%   Body mass index is 26.98 kg/m.    Physical Exam  Nursing note and  vitals reviewed. Constitutional: She is oriented to person, place, and time. She appears well-developed. No distress.  HENT:  Head: Normocephalic and atraumatic.  Mouth/Throat: Oropharynx is clear and moist and mucous membranes are normal.    Eyes: Conjunctivae are normal.  Cardiovascular: Normal rate and regular rhythm.  No murmur heard. Respiratory: Effort normal and breath sounds normal. No respiratory distress.  Musculoskeletal: She exhibits no edema.  Lymphadenopathy:       Head (right side): No submandibular adenopathy present.       Head (left side): No submandibular adenopathy present.    She has no cervical adenopathy.  Neurological: She is alert and oriented to person, place, and time. She has normal strength. Gait normal.  Skin: Skin is warm. Rash is not vesicular. There is erythema.  Right corner of lower lip with mild edema and erythema.  No vesicular lesions appreciated. See HENT.  Psychiatric: She has a normal mood and affect. Her speech is normal.  Well groomed, good eye contact.      ASSESSMENT AND PLAN:  Ms. Dailin was seen today for lip infection.  Diagnoses and all orders for this visit:  Lip edema  She reports similar symptoms in the past after starting Bactrim,? Angioedema. Improving. 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 Instructed about warning signs.  Vesicular eruption  ? HSV I. Topical acyclovir recommended. Explained that if this is viral, treatment will not help much if started after 48 hours. F/U as needed.  Other orders -     acyclovir ointment (ZOVIRAX) 5 %; Apply 1 application topically every 6 (six) hours for 7 days.     Return if symptoms worsen or fail to improve.     Milana G. Martinique, MD  [facility]. [place] office.

## 2017-08-20 DIAGNOSIS — D485 Neoplasm of uncertain behavior of skin: Secondary | ICD-10-CM | POA: Diagnosis not present

## 2017-08-20 DIAGNOSIS — L820 Inflamed seborrheic keratosis: Secondary | ICD-10-CM | POA: Diagnosis not present

## 2017-08-24 ENCOUNTER — Encounter: Payer: Self-pay | Admitting: Family Medicine

## 2017-08-25 DIAGNOSIS — D485 Neoplasm of uncertain behavior of skin: Secondary | ICD-10-CM | POA: Diagnosis not present

## 2017-08-25 DIAGNOSIS — L820 Inflamed seborrheic keratosis: Secondary | ICD-10-CM | POA: Diagnosis not present

## 2017-10-22 DIAGNOSIS — M8588 Other specified disorders of bone density and structure, other site: Secondary | ICD-10-CM | POA: Diagnosis not present

## 2017-10-22 DIAGNOSIS — Z1231 Encounter for screening mammogram for malignant neoplasm of breast: Secondary | ICD-10-CM | POA: Diagnosis not present

## 2017-10-22 LAB — HM MAMMOGRAPHY

## 2017-10-23 NOTE — Progress Notes (Signed)
HPI:   Desiree Spencer is a 77 y.o. female, who is here today for 12 months follow up.   She was last seen on 08/19/2017 for acute visit. No new problems since her last visit.  HLD:  Currently on nonpharmacologic treatment.  Lab Results  Component Value Date   CHOL 244 (H) 10/24/2016   HDL 60.90 10/24/2016   LDLCALC 159 (H) 10/24/2016   LDLDIRECT 203.3 08/27/2010   TRIG 118.0 10/24/2016   CHOLHDL 4 10/24/2016     Hypertension:  Currently on HCTZ 25 mg daily  07/2017 last eye exam.  She is taking medications as instructed, no side effects reported.  She has not noted unusual headache, visual changes, exertional chest pain, dyspnea,  focal weakness, or edema.   Lab Results  Component Value Date   CREATININE 0.80 10/24/2016   BUN 15 10/24/2016   NA 138 10/24/2016   K 3.4 (L) 10/24/2016   CL 99 10/24/2016   CO2 30 10/24/2016   GERD: Currently she is asymptomatic. She is on Zantac 150 mg daily.  Denies abdominal pain, nausea, vomiting, changes in bowel habits, blood in stool or melena.  She has noted mild weight gain, she is eating ice cream more frequent than usual and has not been exercising consistently.   Review of Systems  Constitutional: Negative for activity change, appetite change, fatigue and fever.  HENT: Negative for mouth sores, nosebleeds and trouble swallowing.   Eyes: Negative for redness and visual disturbance.  Respiratory: Negative for cough, shortness of breath and wheezing.   Cardiovascular: Negative for chest pain, palpitations and leg swelling.  Gastrointestinal: Negative for abdominal pain, nausea and vomiting.       Negative for changes in bowel habits.  Genitourinary: Negative for decreased urine volume and hematuria.  Neurological: Negative for syncope, weakness and headaches.      Current Outpatient Medications on File Prior to Visit  Medication Sig Dispense Refill  . Calcium-Magnesium-Vitamin D (CALCIUM 500 PO) Take  by mouth.    . fish oil-omega-3 fatty acids 1000 MG capsule Take 1 g by mouth daily.      . fluticasone (FLONASE) 50 MCG/ACT nasal spray Place into both nostrils daily.    . magnesium oxide (MAG-OX) 400 MG tablet Take 400 mg by mouth daily.    . Melatonin 5 MG TABS Take by mouth.    Marland Kitchen POTASSIUM PO Take 40 mg by mouth.     . ranitidine (ZANTAC) 150 MG tablet Take 150 mg by mouth daily.     No current facility-administered medications on file prior to visit.      Past Medical History:  Diagnosis Date  . Acute cystitis 07/22/2007  . ALLERGIC RHINITIS 08/14/2006  . [place] DISEASE, LUMBAR 08/07/2009  . DIVERTICULITIS, ACUTE 06/07/2008  . Extrinsic asthma, unspecified 03/30/2007  . GERD 08/14/2006  . HEPATITIS B, HX OF 08/14/2006  . HYPERLIPIDEMIA 07/01/2008  . HYPERTENSION 08/14/2006  . OSTEOARTHRITIS 08/14/2006  . PILAR CYST 07/20/2008  . ROTATOR CUFF INJURY, RIGHT SHOULDER 11/11/2008   Allergies  Allergen Reactions  . Bactrim [Sulfamethoxazole-Trimethoprim] Swelling    Social History   Socioeconomic History  . Marital status: Married    Spouse name: Not on file  . Number of children: Not on file  . Years of education: Not on file  . Highest education level: Not on file  Occupational History  . Not on file  Social Needs  . Financial resource strain: Not on file  .  Food insecurity:    Worry: Not on file    Inability: Not on file  . Transportation needs:    Medical: Not on file    Non-medical: Not on file  Tobacco Use  . Smoking status: Never Smoker  . Smokeless tobacco: Never Used  Substance and Sexual Activity  . Alcohol use: No    Frequency: Never  . Drug use: Not on file  . Sexual activity: Not on file  Lifestyle  . Physical activity:    Days per week: Not on file    Minutes per session: Not on file  . Stress: Not on file  Relationships  . Social connections:    Talks on phone: Not on file    Gets together: Not on file    Attends religious service: Not on file     Active member of club or organization: Not on file    Attends meetings of clubs or organizations: Not on file    Relationship status: Not on file  Other Topics Concern  . Not on file  Social History Narrative  . Not on file    Vitals:   10/24/17 0802  BP: 118/72  Pulse: 96  Resp: 12  Temp: 97.8 F (36.6 C)  SpO2: 96%   Body mass index is 27.46 kg/m. Wt Readings from Last 3 Encounters:  10/24/17 165 lb (74.8 kg)  08/19/17 162 lb 2 oz (73.5 kg)  05/27/17 159 lb (72.1 kg)     Physical Exam  Nursing note and vitals reviewed. Constitutional: She is oriented to person, place, and time. She appears well-developed and well-nourished. No distress.  HENT:  Head: Normocephalic and atraumatic.  Mouth/Throat: Oropharynx is clear and moist and mucous membranes are normal.  Eyes: Pupils are equal, round, and reactive to light. Conjunctivae are normal.  Cardiovascular: Normal rate and regular rhythm.  No murmur heard. Pulses:      Dorsalis pedis pulses are 2+ on the right side, and 2+ on the left side.  Respiratory: Effort normal and breath sounds normal. No respiratory distress.  GI: Soft. She exhibits no mass. There is no hepatomegaly. There is no tenderness.  Musculoskeletal: She exhibits no edema.  Lymphadenopathy:    She has no cervical adenopathy.  Neurological: She is alert and oriented to person, place, and time. She has normal strength. No cranial nerve deficit. Gait normal.  Skin: Skin is warm. No erythema.  Psychiatric: She has a normal mood and affect.  Well groomed, good eye contact.    ASSESSMENT AND PLAN:   Desiree Spencer was seen today for 12 months follow-up.  Orders Placed This Encounter  Procedures  . Flu vaccine HIGH DOSE PF  . Lipid panel  . Basic metabolic panel  . Urinalysis, Routine w reflex microscopic   Lab Results  Component Value Date   CHOL 216 (H) 10/24/2017   HDL 59.80 10/24/2017   LDLCALC 125 (H) 10/24/2017   LDLDIRECT 203.3  08/27/2010   TRIG 158.0 (H) 10/24/2017   CHOLHDL 4 10/24/2017   Lab Results  Component Value Date   CREATININE 0.78 10/24/2017   BUN 17 10/24/2017   NA 138 10/24/2017   K 3.8 10/24/2017   CL 99 10/24/2017   CO2 32 10/24/2017   The 10-year ASCVD risk score Mikey Bussing DC Jr., et al., 2013) is: 22.4%   Values used to calculate the score:     Age: 50 years     Sex: Female     Is Non-Hispanic  African American: No     Diabetic: No     Tobacco smoker: No     Systolic Blood Pressure: 416 mmHg     Is BP treated: Yes     HDL Cholesterol: 59.8 mg/dL     Total Cholesterol: 216 mg/dL  Overweight (BMI 25.0-29.9) 6 pounds weight gain since her last visit in 05/2017. Recommend following a healthy diet and to engage in regular physical activity, she is planning on starting walking again. Calorie count also recommended.  Hyperlipidemia Continue nonpharmacologic treatment, low-fat diet. Further recommendations will be given according to lipid panel results and 10 years ASCVD risk.  Essential hypertension Adequately controlled. Eye exam is current. No changes in HCTZ 25 mg daily, we discussed some side effects.  Potassium rich diet also recommended. Follow-up in 12 months, before if needed.  GERD Problem is well controlled with Zantac 150 mg daily. No changes in current management. GERD precautions to continue. Follow-up in a year, before if needed.     Deliyah G. Martinique, MD  [facility]. [place] office.

## 2017-10-24 ENCOUNTER — Encounter: Payer: Self-pay | Admitting: Family Medicine

## 2017-10-24 ENCOUNTER — Ambulatory Visit (INDEPENDENT_AMBULATORY_CARE_PROVIDER_SITE_OTHER): Payer: Medicare Other | Admitting: Family Medicine

## 2017-10-24 VITALS — BP 118/72 | HR 96 | Temp 97.8°F | Resp 12 | Ht 65.0 in | Wt 165.0 lb

## 2017-10-24 DIAGNOSIS — R3129 Other microscopic hematuria: Secondary | ICD-10-CM | POA: Diagnosis not present

## 2017-10-24 DIAGNOSIS — Z23 Encounter for immunization: Secondary | ICD-10-CM

## 2017-10-24 DIAGNOSIS — E663 Overweight: Secondary | ICD-10-CM | POA: Diagnosis not present

## 2017-10-24 DIAGNOSIS — I10 Essential (primary) hypertension: Secondary | ICD-10-CM

## 2017-10-24 DIAGNOSIS — K219 Gastro-esophageal reflux disease without esophagitis: Secondary | ICD-10-CM

## 2017-10-24 DIAGNOSIS — E785 Hyperlipidemia, unspecified: Secondary | ICD-10-CM

## 2017-10-24 LAB — URINALYSIS, ROUTINE W REFLEX MICROSCOPIC
BILIRUBIN URINE: NEGATIVE
Hgb urine dipstick: NEGATIVE
KETONES UR: NEGATIVE
Nitrite: NEGATIVE
PH: 7 (ref 5.0–8.0)
RBC / HPF: NONE SEEN (ref 0–?)
Specific Gravity, Urine: 1.01 (ref 1.000–1.030)
TOTAL PROTEIN, URINE-UPE24: NEGATIVE
UROBILINOGEN UA: 0.2 (ref 0.0–1.0)
Urine Glucose: NEGATIVE

## 2017-10-24 LAB — LIPID PANEL
Cholesterol: 216 mg/dL — ABNORMAL HIGH (ref 0–200)
HDL: 59.8 mg/dL (ref >39.00)
LDL CALC: 125 mg/dL — AB (ref 0–99)
NONHDL: 156.43
Total CHOL/HDL Ratio: 4
Triglycerides: 158 mg/dL — ABNORMAL HIGH (ref 0.0–149.0)
VLDL: 31.6 mg/dL (ref 0.0–40.0)

## 2017-10-24 LAB — BASIC METABOLIC PANEL
BUN: 17 mg/dL (ref 6–23)
CALCIUM: 9.4 mg/dL (ref 8.4–10.5)
CO2: 32 mEq/L (ref 19–32)
Chloride: 99 mEq/L (ref 96–112)
Creatinine, Ser: 0.78 mg/dL (ref 0.40–1.20)
GFR: 76.07 mL/min (ref >60.00)
Glucose, Bld: 97 mg/dL (ref 70–99)
Potassium: 3.8 mEq/L (ref 3.5–5.1)
Sodium: 138 mEq/L (ref 135–145)

## 2017-10-24 MED ORDER — HYDROCHLOROTHIAZIDE 25 MG PO TABS: 25.0000 mg | ORAL_TABLET | Freq: Every day | ORAL | 3 refills | Status: DC

## 2017-10-24 MED ORDER — ZOSTER VAC RECOMB ADJUVANTED 50 MCG/0.5ML IM SUSR: INTRAMUSCULAR | 1 refills | Status: DC

## 2017-10-24 NOTE — Patient Instructions (Addendum)
A few things to remember from today's visit:   Hyperlipidemia, unspecified hyperlipidemia type - Plan: Lipid panel  Essential hypertension - Plan: Basic metabolic panel, hydrochlorothiazide (HYDRODIURIL) 25 MG tablet  Flu vaccine need - Plan: Flu vaccine HIGH DOSE PF  A few tips:  -As we age balance is not as good as it was, so there is a higher risks for falls. Please remove small rugs and furniture that is "in your way" and could increase the risk of falls. Stretching exercises may help with fall prevention: Yoga and Tai Chi are some examples. Low impact exercise is better, so you are not very achy the next day.  -Sun screen and avoidance of direct sun light recommended. Caution with dehydration, if working outdoors be sure to drink enough fluids.  - Some medications are not safe as we age, increases the risk of side effects and can potentially interact with other medication you are also taken;  including some of over the counter medications. Be sure to let me know when you start a new medication even if it is a dietary/vitamin supplement.   -Healthy diet low in red meet/animal fat and sugar + regular physical activity is recommended.   Please be sure medication list is accurate. If a new problem present, please set up appointment sooner than planned today.

## 2017-10-24 NOTE — Assessment & Plan Note (Signed)
6 pounds weight gain since her last visit in 05/2017. Recommend following a healthy diet and to engage in regular physical activity, she is planning on starting walking again. Calorie count also recommended.

## 2017-10-24 NOTE — Assessment & Plan Note (Signed)
Problem is well controlled with Zantac 150 mg daily. No changes in current management. GERD precautions to continue. Follow-up in a year, before if needed.

## 2017-10-24 NOTE — Assessment & Plan Note (Signed)
Continue nonpharmacologic treatment, low-fat diet. Further recommendations will be given according to lipid panel results and 10 years ASCVD risk.

## 2017-10-24 NOTE — Assessment & Plan Note (Signed)
Adequately controlled. Eye exam is current. No changes in HCTZ 25 mg daily, we discussed some side effects.  Potassium rich diet also recommended. Follow-up in 12 months, before if needed.

## 2017-10-26 ENCOUNTER — Encounter: Payer: Self-pay | Admitting: Family Medicine

## 2017-10-27 ENCOUNTER — Encounter: Payer: Self-pay | Admitting: *Deleted

## 2017-11-03 ENCOUNTER — Encounter: Payer: Self-pay | Admitting: Family Medicine

## 2017-11-06 ENCOUNTER — Telehealth: Payer: Self-pay | Admitting: Family Medicine

## 2017-11-06 ENCOUNTER — Ambulatory Visit: Payer: Self-pay | Admitting: *Deleted

## 2017-11-06 NOTE — Telephone Encounter (Signed)
Copied from [place] [phone]. Topic: Quick Communication - See Telephone Encounter >> Nov 06, 2017  9:02 AM Ahmed Prima L wrote: CRM for notification. See Telephone encounter for: 11/06/17.  Patient would like Dr Martinique to re-fill her " Imitrex 25 mg " , she said she wrote a script for her about 2-3 years ago & she said she would like to again due to a migraine.   [place], [place] - [address] [place] [address]

## 2017-11-06 NOTE — Telephone Encounter (Signed)
Okay for refill? Please advise 

## 2017-11-06 NOTE — Telephone Encounter (Signed)
Pt called to request a refill of Imitrex; has not had filled x 2-3 years.  NT called pt to assess symptoms.  Pt reports H/O migraines, "Stopped 3 years ago." States was on Imitrex, effective.  Has had 3 headaches in past 2 weeks, lasting 2-3 days.  Has taken IBU, ineffective. Presently has headache, 7/10. States headache is the same as when she had migraines , "Right between my eyes."  Denies any nausea, vomiting, no visual changes. Pt requesting refill of Imitrex. Pt. made aware she may need to be seen. Requesting NT to ask for refill first, states she will come in if necessary.  Please advise:  [phone] Reason for Disposition . Similar to previously diagnosed migraine headaches  Answer Assessment - Initial Assessment Questions 1. LOCATION: "Where does it hurt?"      Right between eyes 2. ONSET: "When did the headache start?" (Minutes, hours or days)      This am 0130 3. PATTERN: "Does the pain come and go, or has it been constant since it started?"    Constant 4. SEVERITY: "How bad is the pain?" and "What does it keep you from doing?"  (e.g., Scale 1-10; mild, moderate, or severe)   - MILD (1-3): doesn't interfere with normal activities    - MODERATE (4-7): interferes with normal activities or awakens from sleep    - SEVERE (8-10): excruciating pain, unable to do any normal activities        7/10 5. RECURRENT SYMPTOM: "Have you ever had headaches before?" If so, ask: "When was the last time?" and "What happened that time?"      *No Answer* 6. YEs, migraines  SE: "What do you think is causing the headache?"     *No Answer* 7. MIGRAINE: "Have you been diagnosed with migraine headaches?" If so, ask: "Is this headache similar?"      YEs, the same 8. HEAD INJURY: "Has there been any recent injury to the head?"      no 9. OTHER SYMPTOMS: "Do you have any other symptoms?" (fever, stiff neck, eye pain, sore throat, cold symptoms)    no  Protocols used: HEADACHE-A-AH

## 2017-11-07 ENCOUNTER — Encounter: Payer: Self-pay | Admitting: Family Medicine

## 2017-11-07 ENCOUNTER — Ambulatory Visit (INDEPENDENT_AMBULATORY_CARE_PROVIDER_SITE_OTHER): Payer: Medicare Other | Admitting: Family Medicine

## 2017-11-07 DIAGNOSIS — G43009 Migraine without aura, not intractable, without status migrainosus: Secondary | ICD-10-CM

## 2017-11-07 DIAGNOSIS — R519 Headache, unspecified: Secondary | ICD-10-CM

## 2017-11-07 DIAGNOSIS — R51 Headache: Secondary | ICD-10-CM

## 2017-11-07 MED ORDER — TOPIRAMATE 25 MG PO TABS: 25.0000 mg | ORAL_TABLET | Freq: Every day | ORAL | 1 refills | Status: DC

## 2017-11-07 NOTE — Patient Instructions (Signed)
A few things to remember from today's visit:   Nonintractable headache, unspecified chronicity pattern, unspecified headache type - Plan: CBC with Differential/Platelet, MR Brain Wo Contrast  Topamax at bedtime. Continue Imitrex. Headache diary.   Please be sure medication list is accurate. If a new problem present, please set up appointment sooner than planned today.

## 2017-11-07 NOTE — Progress Notes (Signed)
ACUTE VISIT   HPI:  Chief Complaint  Patient presents with  . Migraine    Pt present for migraines. Pt is wanting Imitrex for relief.    [name] is a 77 y.o. female, who is here today complaining of 3 weeks of intermittent episodes of headaches, which are similar to her usual migraine. She was diagnosed with migraine headaches at the age of 52. She did not have episodes for 7 to 8 years. She has had 3 migraine attacks in 3 weeks. One time she woke up with headache. Pounding like headache,right frontal/retro ocular Headache last about 3 days. + Photophobia.    She has not identified exacerbating factors. Ibuprofen helps some. No associated nausea, vomiting, or focal deficit. Today headache is better, 2/10.  She has taken Imitrex years ago and it helped.   Lab Results  Component Value Date   WBC 7.5 12/05/2015   HGB 14.6 12/05/2015   HCT 42.5 12/05/2015   MCV 84.1 12/05/2015   PLT 254.0 12/05/2015   Lab Results  Component Value Date   TSH 2.15 12/05/2015    Review of Systems  Constitutional: Negative for activity change, appetite change, fatigue, fever and unexpected weight change.  HENT: Negative for mouth sores, nosebleeds and trouble swallowing.   Eyes: Positive for photophobia. Negative for redness and visual disturbance.  Respiratory: Negative for cough, shortness of breath and wheezing.   Cardiovascular: Negative for chest pain, palpitations and leg swelling.  Gastrointestinal: Negative for abdominal pain, nausea and vomiting.       Negative for changes in bowel habits.  Genitourinary: Negative for decreased urine volume, dysuria and hematuria.  Neurological: Positive for headaches. Negative for seizures, syncope, weakness and numbness.      Current Outpatient Medications on File Prior to Visit  Medication Sig Dispense Refill  . Calcium-Magnesium-Vitamin D (CALCIUM 500 PO) Take by mouth.    . fish oil-omega-3 fatty acids 1000 MG  capsule Take 1 g by mouth daily.      . fluticasone (FLONASE) 50 MCG/ACT nasal spray Place into both nostrils daily.    . hydrochlorothiazide (HYDRODIURIL) 25 MG tablet Take 1 tablet (25 mg total) by mouth daily. 90 tablet 3  . magnesium oxide (MAG-OX) 400 MG tablet Take 400 mg by mouth daily.    . Melatonin 5 MG TABS Take by mouth.    Marland Kitchen POTASSIUM PO Take 40 mg by mouth.     . ranitidine (ZANTAC) 150 MG tablet Take 150 mg by mouth daily.    Marland Kitchen Zoster Vaccine Adjuvanted [facility]) injection 0.5 ml in muscle and repeat in 8 weeks 0.5 mL 1   No current facility-administered medications on file prior to visit.      Past Medical History:  Diagnosis Date  . Acute cystitis 07/22/2007  . ALLERGIC RHINITIS 08/14/2006  . [place] DISEASE, LUMBAR 08/07/2009  . DIVERTICULITIS, ACUTE 06/07/2008  . Extrinsic asthma, unspecified 03/30/2007  . GERD 08/14/2006  . HEPATITIS B, HX OF 08/14/2006  . HYPERLIPIDEMIA 07/01/2008  . HYPERTENSION 08/14/2006  . OSTEOARTHRITIS 08/14/2006  . PILAR CYST 07/20/2008  . ROTATOR CUFF INJURY, RIGHT SHOULDER 11/11/2008   Allergies  Allergen Reactions  . Bactrim [Sulfamethoxazole-Trimethoprim] Swelling    Social History   Socioeconomic History  . Marital status: Married    Spouse name: Not on file  . Number of children: Not on file  . Years of education: Not on file  . Highest education level: Not on file  Occupational History  .  Not on file  Social Needs  . Financial resource strain: Not on file  . Food insecurity:    Worry: Not on file    Inability: Not on file  . Transportation needs:    Medical: Not on file    Non-medical: Not on file  Tobacco Use  . Smoking status: Never Smoker  . Smokeless tobacco: Never Used  Substance and Sexual Activity  . Alcohol use: No    Frequency: Never  . Drug use: Not on file  . Sexual activity: Not on file  Lifestyle  . Physical activity:    Days per week: Not on file    Minutes per session: Not on file  . Stress: Not on file    Relationships  . Social connections:    Talks on phone: Not on file    Gets together: Not on file    Attends religious service: Not on file    Active member of club or organization: Not on file    Attends meetings of clubs or organizations: Not on file    Relationship status: Not on file  Other Topics Concern  . Not on file  Social History Narrative  . Not on file    Vitals:   11/07/17 1129  BP: 140/80  Pulse: 80  Resp: 12  Temp: 98 F (36.7 C)  SpO2: 99%   Body mass index is 27.79 kg/m.   Physical Exam  Nursing note and vitals reviewed. Constitutional: She is oriented to person, place, and time. She appears well-developed. No distress.  HENT:  Head: Normocephalic and atraumatic.  Mouth/Throat: Oropharynx is clear and moist and mucous membranes are normal.  Eyes: Pupils are equal, round, and reactive to light. Conjunctivae are normal.  Cardiovascular: Normal rate and regular rhythm.  No murmur heard. Pulses:      Dorsalis pedis pulses are 2+ on the right side, and 2+ on the left side.  Respiratory: Effort normal and breath sounds normal. No respiratory distress.  GI: Soft. She exhibits no mass. There is no hepatomegaly. There is no tenderness.  Musculoskeletal: She exhibits no edema.  Lymphadenopathy:    She has no cervical adenopathy.  Neurological: She is alert and oriented to person, place, and time. She has normal strength. No cranial nerve deficit. Gait normal.  Skin: Skin is warm. No rash noted. No erythema.  Psychiatric: She has a normal mood and affect.  Well groomed, good eye contact.      ASSESSMENT AND PLAN:   [name] for migraine.  Diagnoses and all orders for this visit:  Nonintractable headache, unspecified chronicity pattern, unspecified headache type -     CBC with Differential/Platelet -     MR Brain Wo Contrast; Future  Migraine without aura and without status migrainosus, not intractable -     topiramate (TOPAMAX) 25  MG tablet; Take 1 tablet (25 mg total) by mouth at bedtime.  Headache seems to be similar to episodes of migraine she was having about 7 to 8 years ago. Because headaches are becoming more frequent, I think is appropriate to obtain brain imaging. We discussed a few treatment options for migraine headaches, she agrees with trying Topamax 25 mg at bedtime. We reviewed some side effects of medication. She was clearly instructed about warning signs. Instructed to have a headache diary. Follow-up in 4 weeks.   Return in about 4 weeks (around 12/05/2017) for HA.       Jordann G. Martinique, MD  [facility]. [place] office.

## 2017-11-07 NOTE — Telephone Encounter (Signed)
She is on my schedule today. Thanks, BJ

## 2017-11-07 NOTE — Telephone Encounter (Signed)
Noted  

## 2017-11-09 ENCOUNTER — Encounter: Payer: Self-pay | Admitting: Family Medicine

## 2017-11-12 ENCOUNTER — Encounter: Payer: Self-pay | Admitting: Family Medicine

## 2017-11-17 ENCOUNTER — Other Ambulatory Visit: Payer: Medicare Other

## 2017-12-30 DIAGNOSIS — K219 Gastro-esophageal reflux disease without esophagitis: Secondary | ICD-10-CM | POA: Diagnosis not present

## 2017-12-30 DIAGNOSIS — R030 Elevated blood-pressure reading, without diagnosis of hypertension: Secondary | ICD-10-CM | POA: Diagnosis not present

## 2017-12-30 DIAGNOSIS — E785 Hyperlipidemia, unspecified: Secondary | ICD-10-CM | POA: Diagnosis not present

## 2018-01-05 ENCOUNTER — Other Ambulatory Visit: Payer: Self-pay

## 2018-02-06 ENCOUNTER — Ambulatory Visit: Payer: Medicare Other

## 2018-02-09 ENCOUNTER — Ambulatory Visit: Payer: Medicare Other

## 2018-03-09 DIAGNOSIS — H10411 Chronic giant papillary conjunctivitis, right eye: Secondary | ICD-10-CM | POA: Diagnosis not present

## 2018-04-09 DIAGNOSIS — H5213 Myopia, bilateral: Secondary | ICD-10-CM | POA: Diagnosis not present

## 2018-04-09 DIAGNOSIS — H2513 Age-related nuclear cataract, bilateral: Secondary | ICD-10-CM | POA: Diagnosis not present

## 2018-06-24 DIAGNOSIS — H25812 Combined forms of age-related cataract, left eye: Secondary | ICD-10-CM | POA: Diagnosis not present

## 2018-06-24 DIAGNOSIS — H2512 Age-related nuclear cataract, left eye: Secondary | ICD-10-CM | POA: Diagnosis not present

## 2018-08-05 ENCOUNTER — Encounter: Payer: Self-pay | Admitting: Gastroenterology

## 2018-09-02 DIAGNOSIS — H25811 Combined forms of age-related cataract, right eye: Secondary | ICD-10-CM | POA: Diagnosis not present

## 2018-09-02 DIAGNOSIS — H2511 Age-related nuclear cataract, right eye: Secondary | ICD-10-CM | POA: Diagnosis not present

## 2018-09-24 DIAGNOSIS — K219 Gastro-esophageal reflux disease without esophagitis: Secondary | ICD-10-CM | POA: Diagnosis not present

## 2018-09-24 DIAGNOSIS — E785 Hyperlipidemia, unspecified: Secondary | ICD-10-CM | POA: Diagnosis not present

## 2018-09-24 DIAGNOSIS — I10 Essential (primary) hypertension: Secondary | ICD-10-CM | POA: Diagnosis not present

## 2018-09-29 DIAGNOSIS — Z136 Encounter for screening for cardiovascular disorders: Secondary | ICD-10-CM | POA: Diagnosis not present

## 2018-09-29 DIAGNOSIS — E785 Hyperlipidemia, unspecified: Secondary | ICD-10-CM | POA: Diagnosis not present

## 2018-10-28 DIAGNOSIS — Z1231 Encounter for screening mammogram for malignant neoplasm of breast: Secondary | ICD-10-CM | POA: Diagnosis not present

## 2019-03-04 ENCOUNTER — Ambulatory Visit: Payer: Medicare Other | Attending: Internal Medicine

## 2019-03-04 DIAGNOSIS — Z23 Encounter for immunization: Secondary | ICD-10-CM | POA: Insufficient documentation

## 2019-03-04 NOTE — Progress Notes (Signed)
   Covid-19 Vaccination Clinic  Name:  IMANA MINARIK    MRN: [id number] DOB: Aug 18, 1940  03/04/2019  Ms. Chatmon was observed post Covid-19 immunization for 15 minutes without incidence. She was provided with Vaccine Information Sheet and instruction to access the V-Safe system.   Ms. Sweazy was instructed to call 911 with any severe reactions post vaccine: Marland Kitchen Difficulty breathing  . Swelling of your face and throat  . A fast heartbeat  . A bad rash all over your body  . Dizziness and weakness    Immunizations Administered    Name Date Dose VIS Date Route   Pfizer COVID-19 Vaccine 03/04/2019  9:22 AM 0.3 mL 01/29/2019 Intramuscular   Manufacturer: [employer], [employer]   Lot: [id number]   [place]: [id number]

## 2019-03-22 ENCOUNTER — Ambulatory Visit: Payer: Medicare Other | Attending: Internal Medicine

## 2019-03-22 DIAGNOSIS — Z23 Encounter for immunization: Secondary | ICD-10-CM | POA: Insufficient documentation

## 2019-03-22 NOTE — Progress Notes (Signed)
   Covid-19 Vaccination Clinic  Name:  Desiree Spencer    MRN: [id number] DOB: 25-Apr-1940  03/22/2019  [name] was observed post Covid-19 immunization for 15 minutes without incidence. She was provided with Vaccine Information Sheet and instruction to access the V-Safe system.   [name] was instructed to call 911 with any severe reactions post vaccine: Marland Kitchen Difficulty breathing  . Swelling of your face and throat  . A fast heartbeat  . A bad rash all over your body  . Dizziness and weakness    Immunizations Administered    Name Date Dose VIS Date Route   Pfizer COVID-19 Vaccine 03/22/2019  8:57 AM 0.3 mL 01/29/2019 Intramuscular   Manufacturer: [place]   Lot: [id number]   [place]: [id number]

## 2019-04-12 DIAGNOSIS — H524 Presbyopia: Secondary | ICD-10-CM | POA: Diagnosis not present

## 2019-04-12 DIAGNOSIS — Z961 Presence of intraocular lens: Secondary | ICD-10-CM | POA: Diagnosis not present

## 2019-04-12 DIAGNOSIS — H04123 Dry eye syndrome of bilateral lacrimal glands: Secondary | ICD-10-CM | POA: Diagnosis not present

## 2019-04-30 DIAGNOSIS — R300 Dysuria: Secondary | ICD-10-CM | POA: Diagnosis not present

## 2019-06-28 DIAGNOSIS — K219 Gastro-esophageal reflux disease without esophagitis: Secondary | ICD-10-CM | POA: Diagnosis not present

## 2019-06-28 DIAGNOSIS — I10 Essential (primary) hypertension: Secondary | ICD-10-CM | POA: Diagnosis not present

## 2019-06-28 DIAGNOSIS — J309 Allergic rhinitis, unspecified: Secondary | ICD-10-CM | POA: Diagnosis not present

## 2019-09-20 DIAGNOSIS — L820 Inflamed seborrheic keratosis: Secondary | ICD-10-CM | POA: Diagnosis not present

## 2019-09-20 DIAGNOSIS — L7211 Pilar cyst: Secondary | ICD-10-CM | POA: Diagnosis not present

## 2019-10-21 DIAGNOSIS — L820 Inflamed seborrheic keratosis: Secondary | ICD-10-CM | POA: Diagnosis not present

## 2019-11-01 DIAGNOSIS — Z23 Encounter for immunization: Secondary | ICD-10-CM | POA: Diagnosis not present

## 2019-11-01 DIAGNOSIS — Z0000 Encounter for general adult medical examination without abnormal findings: Secondary | ICD-10-CM | POA: Diagnosis not present

## 2019-11-01 DIAGNOSIS — Z0189 Encounter for other specified special examinations: Secondary | ICD-10-CM | POA: Diagnosis not present

## 2019-11-01 DIAGNOSIS — E785 Hyperlipidemia, unspecified: Secondary | ICD-10-CM | POA: Diagnosis not present

## 2019-11-03 DIAGNOSIS — Z1231 Encounter for screening mammogram for malignant neoplasm of breast: Secondary | ICD-10-CM | POA: Diagnosis not present

## 2019-11-04 ENCOUNTER — Encounter: Payer: Self-pay | Admitting: Neurology

## 2019-11-23 DIAGNOSIS — L7211 Pilar cyst: Secondary | ICD-10-CM | POA: Diagnosis not present

## 2019-11-23 DIAGNOSIS — D485 Neoplasm of uncertain behavior of skin: Secondary | ICD-10-CM | POA: Diagnosis not present

## 2020-01-19 NOTE — Progress Notes (Signed)
NEUROLOGY CONSULTATION NOTE  TAYONA SARNOWSKI MRN: [id number] DOB: 1940/08/21  Referring provider: Royal Piedra, MD Primary care provider: Marcello Fennel  Reason for consult:  migraines   Subjective:  Desiree Spencer is a 79 year old right-handed female with HTN, HLD, and ostearthritis who presents for migraines.  History supplemented by referring provider's note.  She has had migraines in the 1950s.  They are severe non-throbbing aching mid frontal pain.  They are associated with some photophobia and phonophobia but no nausea, vomiting, visual disturbance, numbness or weakness.  They would occur 2 to 3 times a month.  They responded well to sumatriptan.  They eventually resolved 2011-2012.  They returned around 2019, which she attributed to starting cranberry capsules.  They resolved after she discontinued the supplement .  However, they returned in 2021 and have been frequent.  She reports 23 days of headache between November 07, 2019 and January 21, 2020.  They last anywhere from 4 hours to the next day (8 hours on average).  She treats with ibuprofen which helps lessen the pain but does not necessarily abort it.  She cannot identify a cause for this new frequent recurrence.  She has had a routine eye exam earlier this year.  She had cataract surgery in 2020.  No new stressors, change in environment, change in medication, change in routine, or preceding head trauma or illness.  Unsure of cause for recurrence and increased frequency.  No recent head trauma, illness, new medication, change in lifestyle.  She has chronic tinnitus for over 20 years.  High-pitched tone persistent.  Usually can block out.  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    Current NSAIDS/analgesics:  ibuprofen Current triptans:  none Current ergotamine:  none Current anti-emetic:  none Current muscle relaxants:  none Current Antihypertensive medications:  none Current Antidepressant medications:   none Current Anticonvulsant medications:  none Current anti-CGRP:  none Current Vitamins/Herbal/Supplements:  none Current Antihistamines/Decongestants:  none Other therapy:  none Hormone/birth control:  none  Past NSAIDS/analgesics:  none Past abortive triptans:  Sumatriptan (effective) Past abortive ergotamine:  none Past muscle relaxants:  none Past anti-emetic:  none Past antihypertensive medications:  none Past antidepressant medications:  none Past anticonvulsant medications:  topiramate 25mg  at bedtime (ineffective) Past anti-CGRP:  none Past vitamins/Herbal/Supplements:  none Past antihistamines/decongestants:  none Other past therapies:  none  Caffeine:   No coffee.  Occasional soda Diet: sips water, decaf iced tea or Crystal Lite throughout the day Exercise:  Walk a mile daily Depression:  no; Anxiety:  no Other pain:  none Sleep hygiene:  ok      PAST MEDICAL HISTORY: Past Medical History:  Diagnosis Date  . Acute cystitis 07/22/2007  . ALLERGIC RHINITIS 08/14/2006  . [place] DISEASE, LUMBAR 08/07/2009  . DIVERTICULITIS, ACUTE 06/07/2008  . Extrinsic asthma, unspecified 03/30/2007  . GERD 08/14/2006  . HEPATITIS B, HX OF 08/14/2006  . HYPERLIPIDEMIA 07/01/2008  . HYPERTENSION 08/14/2006  . OSTEOARTHRITIS 08/14/2006  . PILAR CYST 07/20/2008  . ROTATOR CUFF INJURY, RIGHT SHOULDER 11/11/2008    PAST SURGICAL HISTORY: Past Surgical History:  Procedure Laterality Date  . ABDOMINAL HYSTERECTOMY    . childbirth     X 2  . CHOLECYSTECTOMY      MEDICATIONS: Current Outpatient Medications on File Prior to Visit  Medication Sig Dispense Refill  . Calcium-Magnesium-Vitamin D (CALCIUM 500 PO) Take by mouth.    . fish oil-omega-3 fatty acids 1000 MG capsule Take  1 g by mouth daily.      . fluticasone (FLONASE) 50 MCG/ACT nasal spray Place into both nostrils daily.    . hydrochlorothiazide (HYDRODIURIL) 25 MG tablet Take 1 tablet (25 mg total) by mouth daily. 90 tablet 3  .  magnesium oxide (MAG-OX) 400 MG tablet Take 400 mg by mouth daily.    . Melatonin 5 MG TABS Take by mouth.    Marland Kitchen POTASSIUM PO Take 40 mg by mouth.     . ranitidine (ZANTAC) 150 MG tablet Take 150 mg by mouth daily.    Marland Kitchen topiramate (TOPAMAX) 25 MG tablet Take 1 tablet (25 mg total) by mouth at bedtime. 30 tablet 1  . Zoster Vaccine Adjuvanted [facility]) injection 0.5 ml in muscle and repeat in 8 weeks 0.5 mL 1   No current facility-administered medications on file prior to visit.    ALLERGIES: Allergies  Allergen Reactions  . Bactrim [Sulfamethoxazole-Trimethoprim] Swelling    FAMILY HISTORY: Family History  Problem Relation Age of Onset  . Hyperlipidemia Other   . Hypertension Other   . Anuerysm Mother 51       healthy weight, exercise and lifestyle   . Heart failure Father 18  . Cerebral aneurysm Brother     SOCIAL HISTORY: Social History   Socioeconomic History  . Marital status: Married    Spouse name: Not on file  . Number of children: Not on file  . Years of education: Not on file  . Highest education level: Not on file  Occupational History  . Not on file  Tobacco Use  . Smoking status: Never Smoker  . Smokeless tobacco: Never Used  Substance and Sexual Activity  . Alcohol use: No  . Drug use: Not on file  . Sexual activity: Not on file  Other Topics Concern  . Not on file  Social History Narrative  . Not on file   Social Determinants of Health   Financial Resource Strain:   . Difficulty of Paying Living Expenses: Not on file  Food Insecurity:   . Worried About Charity fundraiser in the Last Year: Not on file  . Ran Out of Food in the Last Year: Not on file  Transportation Needs:   . Lack of Transportation (Medical): Not on file  . Lack of Transportation (Non-Medical): Not on file  Physical Activity:   . Days of Exercise per Week: Not on file  . Minutes of Exercise per Session: Not on file  Stress:   . Feeling of Stress : Not on file  Social  Connections:   . Frequency of Communication with Friends and Family: Not on file  . Frequency of Social Gatherings with Friends and Family: Not on file  . Attends Religious Services: Not on file  . Active Member of Clubs or Organizations: Not on file  . Attends Archivist Meetings: Not on file  . Marital Status: Not on file  Intimate Partner Violence:   . Fear of Current or Ex-Partner: Not on file  . Emotionally Abused: Not on file  . Physically Abused: Not on file  . Sexually Abused: Not on file    Objective:  Blood pressure (!) 154/82, pulse 97, height 5\' 4"  (1.626 m), weight 157 lb 9.6 oz (71.5 kg), SpO2 95 %. General: No acute distress.  Patient appears well-groomed.   Head:  Normocephalic/atraumatic Eyes:  fundi examined but not visualized Neck: supple, no paraspinal tenderness, full range of motion Back: No paraspinal tenderness Heart:  regular rate and rhythm Lungs: Clear to auscultation bilaterally. Vascular: No carotid bruits. Neurological Exam: Mental status: alert and oriented to person, place, and time, recent and remote memory intact, fund of knowledge intact, attention and concentration intact, speech fluent and not dysarthric, language intact. Cranial nerves: CN I: not tested CN II: pupils equal, round and reactive to light, visual fields intact CN III, IV, VI:  full range of motion, no nystagmus, no ptosis CN V: facial sensation intact. CN VII: upper and lower face symmetric CN VIII: hearing intact CN IX, X: gag intact, uvula midline CN XI: sternocleidomastoid and trapezius muscles intact CN XII: tongue midline Bulk & Tone: normal, no fasciculations. Motor:  muscle strength 5/5 throughout Sensation:  Pinprick, temperature and vibratory sensation intact. Deep Tendon Reflexes:  2+ throughout,  toes downgoing.   Finger to nose testing:  Without dysmetria.   Heel to shin:  Without dysmetria.   Gait:  Normal station and stride.  Romberg  negative.  Assessment/Plan:   Increased frequency of migraines without aura  1.  Given sudden recurrence of migraines with increased frequency, will check MRI of brain without contrast 2.  For preventative management, start nortriptyline 10mg  at bedtime.  We can increase to 25mg  at bedtime in 4 weeks if needed. 3.  For rescue, continue ibuprofen or may use Excedrin Migraine.  Limit use of pain relievers to no more than 2 days out of week to prevent risk of rebound or medication-overuse headache.  Sumatriptan previously effective but would avoid triptans due to her age. 4.  Keep headache diary 5.  Continue caffeine cessation, exercise, hydration, proper diet, proper sleep hygiene. 6.  Follow up 4 to 6 months.    Thank you for allowing me to take part in the care of this patient.  Metta Clines, DO  CC: Royal Piedra, MD

## 2020-01-21 ENCOUNTER — Other Ambulatory Visit: Payer: Self-pay

## 2020-01-21 ENCOUNTER — Encounter: Payer: Self-pay | Admitting: Neurology

## 2020-01-21 ENCOUNTER — Ambulatory Visit (INDEPENDENT_AMBULATORY_CARE_PROVIDER_SITE_OTHER): Payer: Medicare Other | Admitting: Neurology

## 2020-01-21 VITALS — BP 154/82 | HR 97 | Ht 64.0 in | Wt 157.6 lb

## 2020-01-21 DIAGNOSIS — G43009 Migraine without aura, not intractable, without status migrainosus: Secondary | ICD-10-CM

## 2020-01-21 DIAGNOSIS — H9313 Tinnitus, bilateral: Secondary | ICD-10-CM

## 2020-01-21 DIAGNOSIS — R519 Headache, unspecified: Secondary | ICD-10-CM | POA: Diagnosis not present

## 2020-01-21 MED ORDER — NORTRIPTYLINE HCL 10 MG PO CAPS: 10.0000 mg | ORAL_CAPSULE | Freq: Every day | ORAL | 5 refills | Status: DC

## 2020-01-21 NOTE — Patient Instructions (Addendum)
°  Will check MRI of brain without contrast. We have sent a referral to [facility] for your MRI and they will call you directly to schedule your appointment. They are located at [place]. If you need to contact them directly please call [phone]. 1.  2. Start nortriptyline 10mg  at bedtime.  Contact us in 4 weeks with update and we can increase dose if needed. 3. Take ibuprofen or even Excedrin Migraine for headache 4. Limit use of pain relievers to no more than 2 days out of the week.  These medications include acetaminophen, NSAIDs (ibuprofen/Advil/Motrin, naproxen/Aleve, triptans (Imitrex/sumatriptan), Excedrin, and narcotics.  This will help reduce risk of rebound headaches. 5. Be aware of common food triggers:  - Caffeine:  coffee, black tea, cola, Mt. Dew  - Chocolate  - Dairy:  aged cheeses (brie, blue, cheddar, gouda, [place], provolone, [place], Swiss, etc), chocolate milk, buttermilk, sour cream, limit eggs and yogurt  - Nuts, peanut butter  - Alcohol  - Cereals/grains:  FRESH breads (fresh bagels, sourdough, doughnuts), yeast productions  - Processed/canned/aged/cured meats (pre-packaged deli meats, hotdogs)  - MSG/glutamate:  soy sauce, flavor enhancer, pickled/preserved/marinated foods  - Sweeteners:  aspartame (Equal, Nutrasweet).  Sugar and Splenda are okay  - Vegetables:  legumes (lima beans, lentils, snow peas, fava beans, pinto peans, peas, garbanzo beans), sauerkraut, onions, olives, pickles  - Fruit:  avocados, bananas, citrus fruit (orange, lemon, grapefruit), mango  - Other:  Frozen meals, macaroni and cheese 6. Routine exercise 7. Stay adequately hydrated (aim for 64 oz water daily) 8. Keep headache diary 9. Maintain proper stress management 10. Maintain proper sleep hygiene 11. Do not skip meals 12. Consider supplements:  magnesium citrate 400mg  daily, riboflavin 400mg  daily, coenzyme Q10 100mg  three times daily.

## 2020-01-24 DIAGNOSIS — H43811 Vitreous degeneration, right eye: Secondary | ICD-10-CM | POA: Diagnosis not present

## 2020-01-31 ENCOUNTER — Telehealth: Payer: Self-pay | Admitting: Neurology

## 2020-01-31 NOTE — Telephone Encounter (Signed)
Pt advised she can take the medication in the morning and see how she do.

## 2020-01-31 NOTE — Telephone Encounter (Signed)
Patient called in stating she has been taking her Nortriptyline and it is really making it hard for her to sleep. She would like to find out if she can take it in the morning instead of night?

## 2020-01-31 NOTE — Telephone Encounter (Signed)
She can try taking it in the morning.

## 2020-02-10 DIAGNOSIS — H00011 Hordeolum externum right upper eyelid: Secondary | ICD-10-CM | POA: Diagnosis not present

## 2020-02-21 ENCOUNTER — Other Ambulatory Visit: Payer: Self-pay | Admitting: Neurology

## 2020-02-21 ENCOUNTER — Telehealth: Payer: Self-pay | Admitting: Neurology

## 2020-02-21 NOTE — Telephone Encounter (Signed)
Patient called to give an update on her nortriptyline after taking it for 30 days. She said, "I'm doing well on it and I'd like to continue taking it."  [facility] on [address]

## 2020-02-21 NOTE — Telephone Encounter (Signed)
Pt advised script sent in with 5 refills. Please call pharmacy

## 2020-02-21 NOTE — Telephone Encounter (Signed)
When I prescribed it last month, I included 5 additional refills.

## 2020-02-23 ENCOUNTER — Other Ambulatory Visit: Payer: Self-pay

## 2020-02-23 ENCOUNTER — Ambulatory Visit
Admission: RE | Admit: 2020-02-23 | Discharge: 2020-02-23 | Disposition: A | Discharge status: Home / Self Care | Payer: Medicare Other | Source: Ambulatory Visit | Attending: Neurology | Admitting: Neurology

## 2020-02-23 DIAGNOSIS — R519 Headache, unspecified: Secondary | ICD-10-CM

## 2020-02-23 DIAGNOSIS — G43009 Migraine without aura, not intractable, without status migrainosus: Secondary | ICD-10-CM

## 2020-02-23 IMAGING — MR MR HEAD W/O CM
12 series · 48 of 48 positions shown · non-contrast
Comparison: None available

CLINICAL DATA: Headache, new or worsening

EXAM:
MRI HEAD WITHOUT CONTRAST
TECHNIQUE: Multiplanar, multiecho pulse sequences of the brain and surrounding
structures were obtained without intravenous contrast.

[Series 5: T1 · sagittal · 4.0mm · 0.75mm/px · 1 of 30 slices shown (1 of 2)]
[im 1/30]
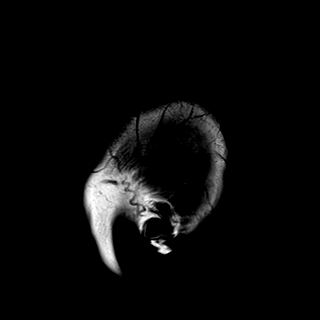

[Series 6: DWI · axial · 3.0mm · 0.94mm/px · z∈[-71,+69]mm · 9 of 160 slices shown (1 of 2)]
[im 1/160]
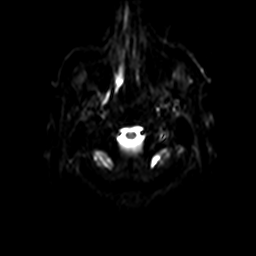
[im 20/160]
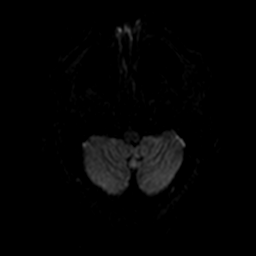
[im 40/160]
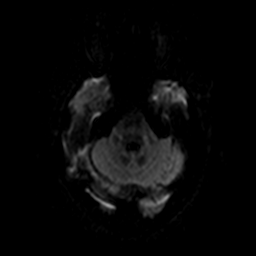
[im 60/160]
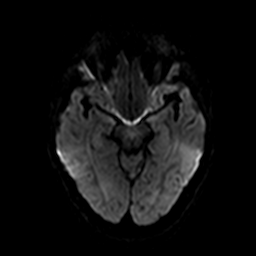
[im 80/160]
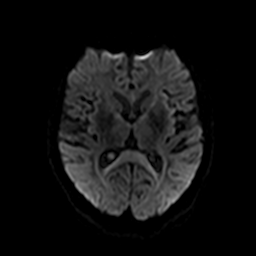
[im 100/160]
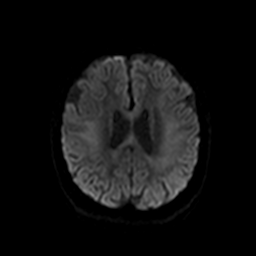
[im 120/160]
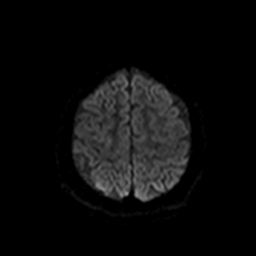
[im 140/160]
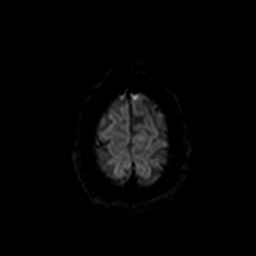
[im 160/160]
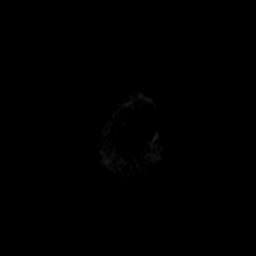

[Series 7: ax dwi_tracew · axial · 3.0mm · 0.94mm/px · z∈[-71,+69]mm · 4 of 80 slices shown]
[im 1/80]
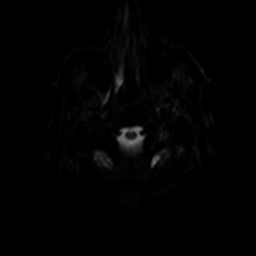
[im 27/80]
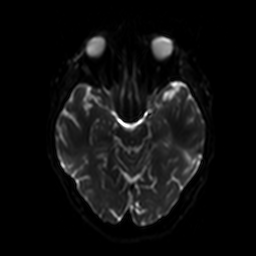
[im 53/80]
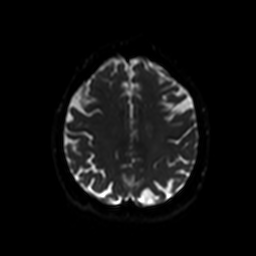
[im 80/80]
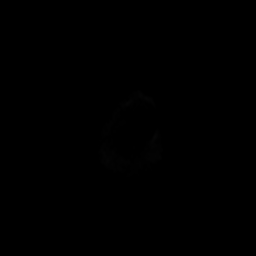

[Series 8: ax dwi_adc · axial · 3.0mm · 0.94mm/px · z∈[-71,+69]mm · 2 of 40 slices shown]
[im 1/40]
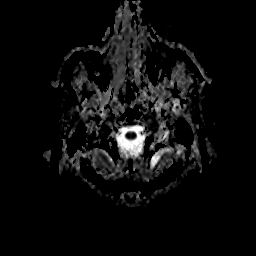
[im 40/40]
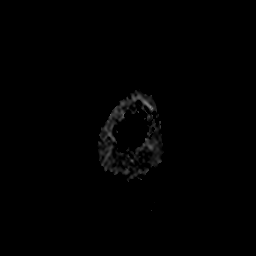

[Series 9: DWI · coronal · 4.0mm · 1.02mm/px · 8 of 153 slices shown (2 of 2)]
[im 1/153]
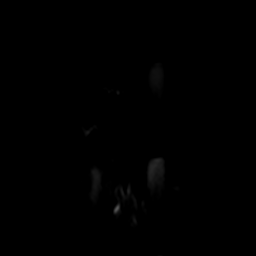
[im 22/153]
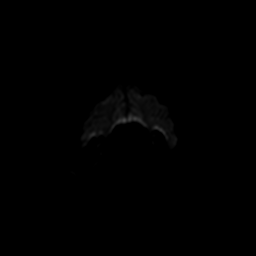
[im 44/153]
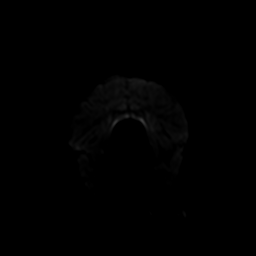
[im 66/153]
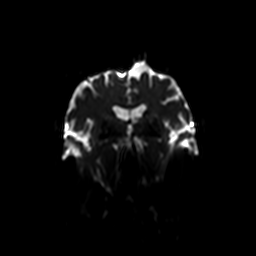
[im 87/153]
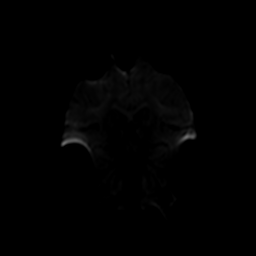
[im 109/153]
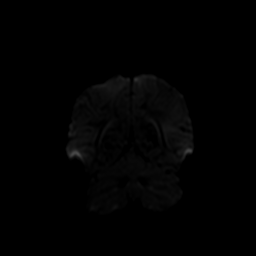
[im 131/153]
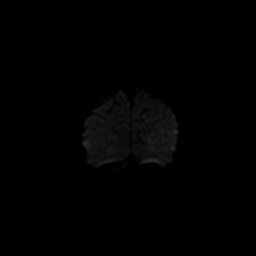
[im 153/153]
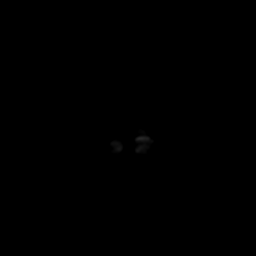

[Series 10: cor dwi_tracew · coronal · 4.0mm · 1.02mm/px · 4 of 75 slices shown]
[im 1/75]
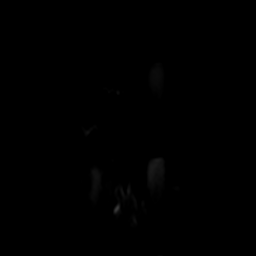
[im 25/75]
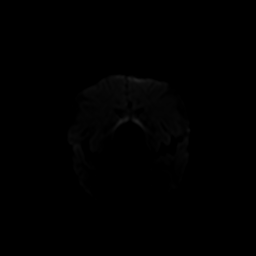
[im 50/75]
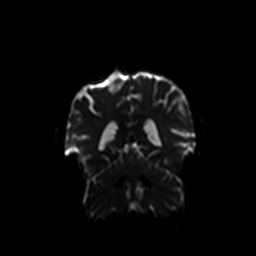
[im 75/75]
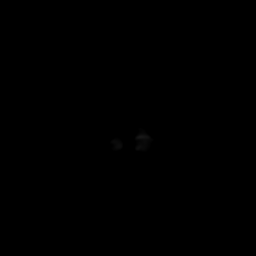

[Series 11: cor dwi_adc · coronal · 4.0mm · 1.02mm/px · 2 of 39 slices shown]
[im 1/39]
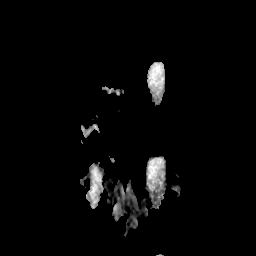
[im 39/39]
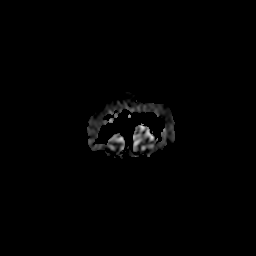

[Series 12: T2 · axial · 4.0mm · 0.36mm/px · z∈[-82,+68]mm · 2 of 30 slices shown (1 of 2)]
[im 1/30]
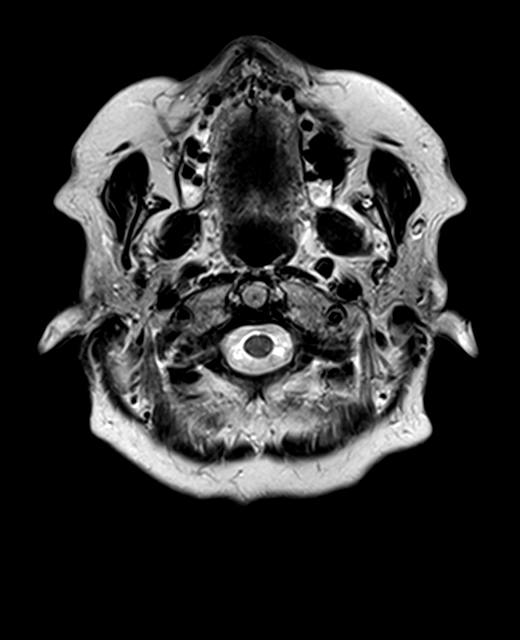
[im 30/30]
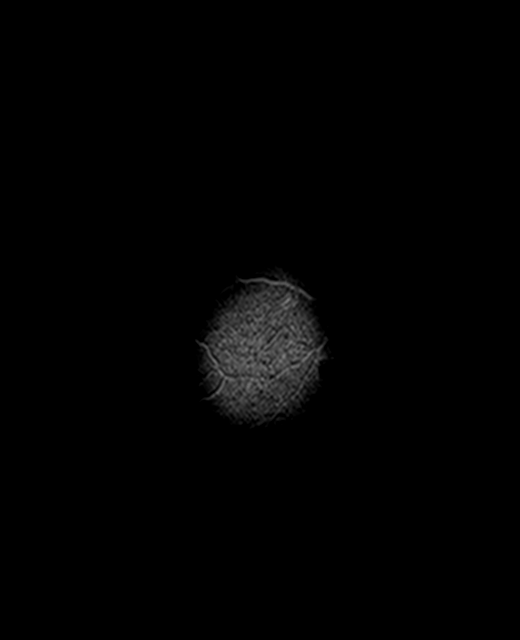

[Series 13: FLAIR · axial · 3.0mm · 0.72mm/px · 1 of 26 slices shown]
[im 1/26]
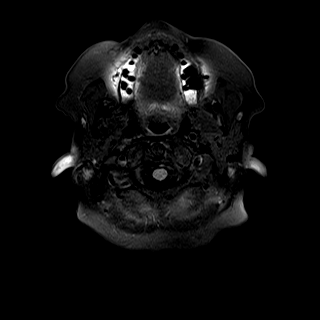

[Series 15: swi_images · axial · 1.5mm · 0.90mm/px · z∈[-78,+64]mm · 5 of 96 slices shown]
[im 1/96]
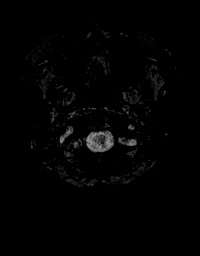
[im 24/96]
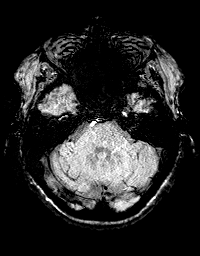
[im 48/96]
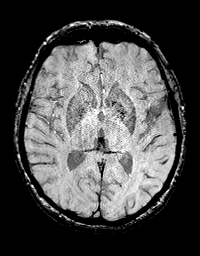
[im 72/96]
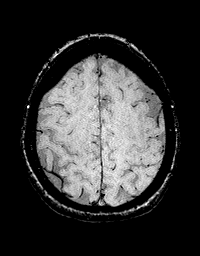
[im 96/96]
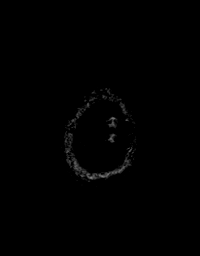

[Series 16: T1 · axial · 1.0mm · 0.94mm/px · z∈[-70,+73]mm · 8 of 144 slices shown (2 of 2)]
[im 1/144]
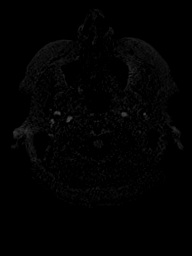
[im 21/144]
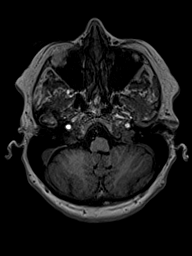
[im 41/144]
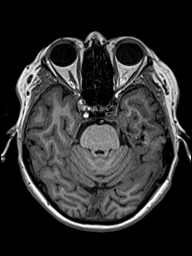
[im 62/144]
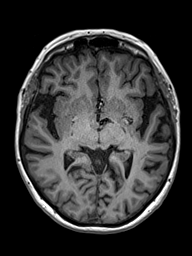
[im 82/144]
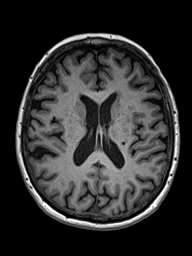
[im 103/144]
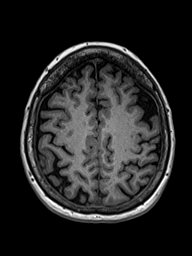
[im 123/144]
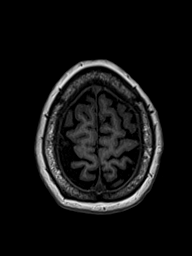
[im 144/144]
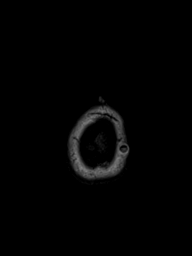

[Series 17: T2 · coronal · 4.5mm · 0.36mm/px · 2 of 29 slices shown (2 of 2)]
[im 1/29]
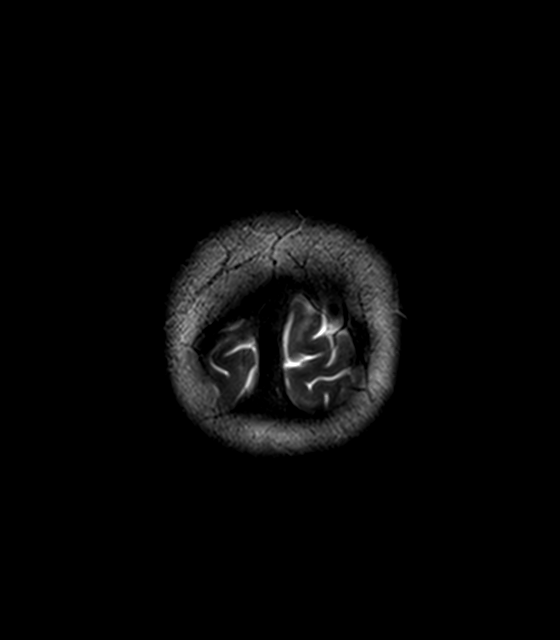
[im 29/29]
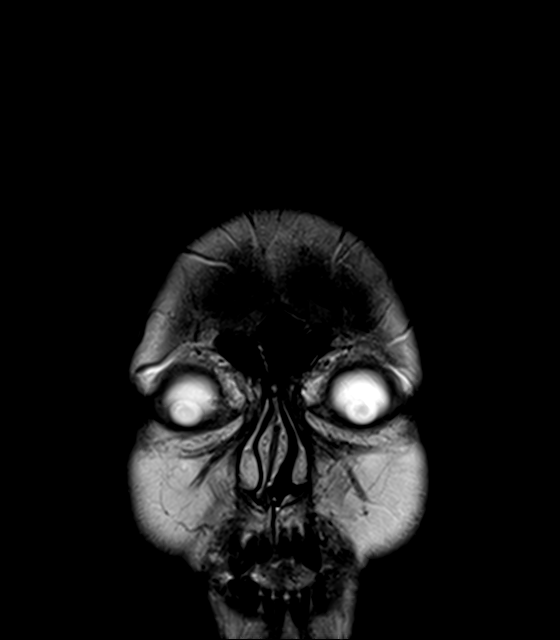

[48 of 48 positions shown; findings below may reference images not displayed]

FINDINGS: Brain: No infarction, hemorrhage, hydrocephalus, extra-axial
collection or mass lesion. Mild for age chronic small vessel
ischemia in the cerebral white matter, essentially age congruent.
Age normal brain volume.

Vascular: Preserved flow voids. Left ICA tortuosity at the skull
base without detected beading.

Skull and upper cervical spine: Normal marrow signal. Cervical spine
spurring.

Sinuses/Orbits: Negative.

Other: Small presumed dermal inclusion cysts in the scalp at the
vertex.
IMPRESSION: Negative brain MRI.  No explanation for headache.

## 2020-02-23 NOTE — Progress Notes (Signed)
Pt advised of her MRI results.

## 2020-07-19 NOTE — Progress Notes (Signed)
NEUROLOGY FOLLOW UP OFFICE NOTE  Desiree Spencer [id number]  Assessment/Plan:   Migraine without aura, without status migrainosus, not intractable - improved  1.  Migraine prevention:  Nortriptyline 10mg  at bedtime 2.  Migraine rescue:  Ibuprofen or Excedrin 3.  Limit use of pain relievers to no more than 2 days out of week to prevent risk of rebound or medication-overuse headache. 4.  Keep headache diary 5.  Follow up 6 months  Subjective:  Desiree Spencer is a 80 year old right-handed female with HTN, HLD, and ostearthritis who follows up for migraines.  UPDATE: MRI of brain without contrast on 02/23/2020 personally reviewed was negative. Started nortriptyline in December.  The tapered off through January and she has had no headaches since then   Current NSAIDS/analgesics:  ibuprofen Current triptans:  none Current ergotamine:  none Current anti-emetic:  none Current muscle relaxants:  none Current Antihypertensive medications:  none Current Antidepressant medications:  nortriptyline 10mg  QHS Current Anticonvulsant medications:  none Current anti-CGRP:  none Current Vitamins/Herbal/Supplements:  none Current Antihistamines/Decongestants:  none Other therapy:  none Hormone/birth control:  none  Caffeine:   No coffee.  Occasional soda Diet: sips water, decaf iced tea or Crystal Lite throughout the day Exercise:  Walk a mile daily Depression:  no; Anxiety:  no Other pain:  none Sleep hygiene:  ok  HISTORY: She has had migraines in the 1950s.  They are severe non-throbbing aching mid frontal pain.  They are associated with some photophobia and phonophobia but no nausea, vomiting, visual disturbance, numbness or weakness.  They would occur 2 to 3 times a month.  They responded well to sumatriptan.  They eventually resolved 2011-2012.  They returned around 2019, which she attributed to starting cranberry capsules.  They resolved after she discontinued the supplement .   However, they returned in 2021 and have been frequent.  She reports 23 days of headache between November 07, 2019 and January 21, 2020.  They last anywhere from 4 hours to the next day (8 hours on average).  She treats with ibuprofen which helps lessen the pain but does not necessarily abort it.  She cannot identify a cause for this new frequent recurrence.  She has had a routine eye exam earlier this year.  She had cataract surgery in 2020.  No new stressors, change in environment, change in medication, change in routine, or preceding head trauma or illness.  Unsure of cause for recurrence and increased frequency.  No recent head trauma, illness, new medication, change in lifestyle.  She has chronic tinnitus for over 20 years.  High-pitched tone persistent.  Usually can block out.  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     Past NSAIDS/analgesics:  none Past abortive triptans:  Sumatriptan (effective) Past abortive ergotamine:  none Past muscle relaxants:  none Past anti-emetic:  none Past antihypertensive medications:  none Past antidepressant medications:  none Past anticonvulsant medications:  topiramate 25mg  at bedtime (ineffective) Past anti-CGRP:  none Past vitamins/Herbal/Supplements:  none Past antihistamines/decongestants:  none Other past therapies:  none   PAST MEDICAL HISTORY: Past Medical History:  Diagnosis Date  . Acute cystitis 07/22/2007  . ALLERGIC RHINITIS 08/14/2006  . [place] DISEASE, LUMBAR 08/07/2009  . DIVERTICULITIS, ACUTE 06/07/2008  . Extrinsic asthma, unspecified 03/30/2007  . GERD 08/14/2006  . HEPATITIS B, HX OF 08/14/2006  . HYPERLIPIDEMIA 07/01/2008  . HYPERTENSION 08/14/2006  . OSTEOARTHRITIS 08/14/2006  . PILAR CYST 07/20/2008  . ROTATOR CUFF INJURY,  RIGHT SHOULDER 11/11/2008    MEDICATIONS: Current Outpatient Medications on File Prior to Visit  Medication Sig Dispense Refill  . atorvastatin (LIPITOR) 40 MG tablet Take 40 mg by mouth at  bedtime.    . Calcium-Magnesium-Vitamin D (CALCIUM 500 PO) Take by mouth. (Patient not taking: Reported on 01/21/2020)    . fish oil-omega-3 fatty acids 1000 MG capsule Take 1 g by mouth daily.   (Patient not taking: Reported on 01/21/2020)    . fluticasone (FLONASE) 50 MCG/ACT nasal spray Place into both nostrils daily. (Patient not taking: Reported on 01/21/2020)    . hydrochlorothiazide (HYDRODIURIL) 25 MG tablet Take 1 tablet (25 mg total) by mouth daily. 90 tablet 3  . magnesium oxide (MAG-OX) 400 MG tablet Take 400 mg by mouth daily. (Patient not taking: Reported on 01/21/2020)    . Melatonin 5 MG TABS Take by mouth. (Patient not taking: Reported on 01/21/2020)    . nortriptyline (PAMELOR) 10 MG capsule Take 1 capsule (10 mg total) by mouth at bedtime. 30 capsule 5  . omeprazole (PRILOSEC) 20 MG capsule Take 20 mg by mouth daily.    Marland Kitchen POTASSIUM PO Take 40 mg by mouth.  (Patient not taking: Reported on 01/21/2020)    . ranitidine (ZANTAC) 150 MG tablet Take 150 mg by mouth daily. (Patient not taking: Reported on 01/21/2020)    . topiramate (TOPAMAX) 25 MG tablet Take 1 tablet (25 mg total) by mouth at bedtime. (Patient not taking: Reported on 01/21/2020) 30 tablet 1  . Zoster Vaccine Adjuvanted [facility]) injection 0.5 ml in muscle and repeat in 8 weeks (Patient not taking: Reported on 01/21/2020) 0.5 mL 1   No current facility-administered medications on file prior to visit.    ALLERGIES: Allergies  Allergen Reactions  . Bactrim [Sulfamethoxazole-Trimethoprim] Swelling    FAMILY HISTORY: Family History  Problem Relation Age of Onset  . Hyperlipidemia Other   . Hypertension Other   . Anuerysm Mother 89       healthy weight, exercise and lifestyle   . Heart failure Father 62  . Cerebral aneurysm Brother       Objective:  Blood pressure (!) 152/91, pulse (!) 109, height 5\' 4"  (1.626 m), weight 151 lb 9.6 oz (68.8 kg), SpO2 99 %. General: No acute distress.  Patient appears  well-groomed.   Head:  Normocephalic/atraumatic Eyes:  Fundi examined but not visualized Neck: supple, no paraspinal tenderness, full range of motion Heart:  Regular rate and rhythm Lungs:  Clear to auscultation bilaterally Back: No paraspinal tenderness Neurological Exam: alert and oriented to person, place, and time.  Speech fluent and not dysarthric, language intact.  CN II-XII intact. Bulk and tone normal, muscle strength 5/5 throughout.  Sensation to light touch intact.  Deep tendon reflexes 2+ throughout.  Finger to nose testing intact.  Gait normal, Romberg negative.  Metta Clines, DO

## 2020-07-21 ENCOUNTER — Other Ambulatory Visit: Payer: Self-pay

## 2020-07-21 ENCOUNTER — Encounter: Payer: Self-pay | Admitting: Neurology

## 2020-07-21 ENCOUNTER — Ambulatory Visit (INDEPENDENT_AMBULATORY_CARE_PROVIDER_SITE_OTHER): Payer: Medicare Other | Admitting: Neurology

## 2020-07-21 VITALS — BP 152/91 | HR 109 | Ht 64.0 in | Wt 151.6 lb

## 2020-07-21 DIAGNOSIS — G43009 Migraine without aura, not intractable, without status migrainosus: Secondary | ICD-10-CM | POA: Diagnosis not present

## 2020-07-21 MED ORDER — NORTRIPTYLINE HCL 10 MG PO CAPS: 10.0000 mg | ORAL_CAPSULE | Freq: Every day | ORAL | 1 refills | Status: AC

## 2020-07-21 NOTE — Patient Instructions (Signed)
Nortriptyline refilled

## 2020-09-16 ENCOUNTER — Other Ambulatory Visit: Payer: Self-pay

## 2020-09-16 ENCOUNTER — Emergency Department (HOSPITAL_COMMUNITY)
Admission: EM | Admit: 2020-09-16 | Discharge: 2020-09-16 | Disposition: A | Discharge status: Home / Self Care | Payer: Medicare Other | Attending: Emergency Medicine | Admitting: Emergency Medicine

## 2020-09-16 ENCOUNTER — Encounter (HOSPITAL_COMMUNITY): Payer: Self-pay

## 2020-09-16 DIAGNOSIS — H748X1 Other specified disorders of right middle ear and mastoid: Secondary | ICD-10-CM | POA: Insufficient documentation

## 2020-09-16 DIAGNOSIS — Z79899 Other long term (current) drug therapy: Secondary | ICD-10-CM | POA: Diagnosis not present

## 2020-09-16 DIAGNOSIS — R040 Epistaxis: Secondary | ICD-10-CM | POA: Diagnosis present

## 2020-09-16 DIAGNOSIS — R000 Tachycardia, unspecified: Secondary | ICD-10-CM | POA: Insufficient documentation

## 2020-09-16 DIAGNOSIS — I10 Essential (primary) hypertension: Secondary | ICD-10-CM | POA: Diagnosis not present

## 2020-09-16 LAB — COMPREHENSIVE METABOLIC PANEL
ALT: 44 U/L (ref 0–44)
AST: 35 U/L (ref 15–41)
Albumin: 3.1 g/dL — ABNORMAL LOW (ref 3.5–5.0)
Alkaline Phosphatase: 107 U/L (ref 38–126)
Anion gap: 9 (ref 5–15)
BUN: 19 mg/dL (ref 8–23)
CO2: 26 mmol/L (ref 22–32)
Calcium: 9.3 mg/dL (ref 8.9–10.3)
Chloride: 104 mmol/L (ref 98–111)
Creatinine, Ser: 0.82 mg/dL (ref 0.44–1.00)
GFR, Estimated: >60 mL/min (ref >60)
Glucose, Bld: 122 mg/dL — ABNORMAL HIGH (ref 70–99)
Potassium: 4.1 mmol/L (ref 3.5–5.1)
Sodium: 139 mmol/L (ref 135–145)
Total Bilirubin: 0.4 mg/dL (ref 0.3–1.2)
Total Protein: 7.1 g/dL (ref 6.5–8.1)

## 2020-09-16 LAB — CBC WITH DIFFERENTIAL/PLATELET
Abs Immature Granulocytes: 0.14 10*3/uL — ABNORMAL HIGH (ref 0.00–0.07)
Basophils Absolute: 0.1 10*3/uL (ref 0.0–0.1)
Basophils Relative: 1 %
Eosinophils Absolute: 0.3 10*3/uL (ref 0.0–0.5)
Eosinophils Relative: 3 %
HCT: 39.1 % (ref 36.0–46.0)
Hemoglobin: 13.2 g/dL (ref 12.0–15.0)
Immature Granulocytes: 1 %
Lymphocytes Relative: 22 %
Lymphs Abs: 2.7 10*3/uL (ref 0.7–4.0)
MCH: 28.3 pg (ref 26.0–34.0)
MCHC: 33.8 g/dL (ref 30.0–36.0)
MCV: 83.9 fL (ref 80.0–100.0)
Monocytes Absolute: 0.8 10*3/uL (ref 0.1–1.0)
Monocytes Relative: 6 %
Neutro Abs: 8.1 10*3/uL — ABNORMAL HIGH (ref 1.7–7.7)
Neutrophils Relative %: 67 %
Platelets: 418 10*3/uL — ABNORMAL HIGH (ref 150–400)
RBC: 4.66 MIL/uL (ref 3.87–5.11)
RDW: 14.1 % (ref 11.5–15.5)
WBC: 12.1 10*3/uL — ABNORMAL HIGH (ref 4.0–10.5)
nRBC: 0 % (ref 0.0–0.2)

## 2020-09-16 LAB — PROTIME-INR
INR: 1 (ref 0.8–1.2)
Prothrombin Time: 13.2 seconds (ref 11.4–15.2)

## 2020-09-16 MED ORDER — LACTATED RINGERS IV BOLUS
500.0000 mL | Freq: Once | INTRAVENOUS | Status: AC
  Administered 2020-09-16: 500 mL via INTRAVENOUS

## 2020-09-16 MED ORDER — LACTATED RINGERS IV BOLUS: 1000.0000 mL | Freq: Once | INTRAVENOUS | Status: DC

## 2020-09-16 MED ORDER — SODIUM CHLORIDE 0.9 % IV BOLUS
500.0000 mL | Freq: Once | INTRAVENOUS | Status: AC
  Administered 2020-09-16: 500 mL via INTRAVENOUS

## 2020-09-16 MED ORDER — LIDOCAINE-EPINEPHRINE (PF) 2 %-1:200000 IJ SOLN
INTRAMUSCULAR | Status: AC
  Filled 2020-09-16: qty 20

## 2020-09-16 MED ORDER — OXYMETAZOLINE HCL 0.05 % NA SOLN
1.0000 | Freq: Once | NASAL | Status: AC
  Administered 2020-09-16: 1 via NASAL
  Filled 2020-09-16: qty 30

## 2020-09-16 MED ORDER — LACTATED RINGERS IV BOLUS: 500.0000 mL | Freq: Once | INTRAVENOUS | Status: DC

## 2020-09-16 NOTE — ED Provider Notes (Signed)
[facility] DEPT Provider Note   CSN: [id number] Arrival date & time: 09/16/20  0840     History Chief Complaint  Patient presents with   Epistaxis    Desiree Spencer is a 80 y.o. female who presents with concern for epistaxis that started at 8:00 this morning spontaneously when she woke from bed.  She states been currently treated for sinusitis with amoxicillin.  States she is been blowing her nose quite a bit for the last 2 weeks due to congestion.  She is not on any anticoagulation.  Per EMS bleeding did improve after Afrin administration in route, however patient is currently oozing from bilateral nostrils.  I personally read this patient medical records.  History of hyperlipidemia, hypertension, GERD, diverticulitis.  She is not on any anticoagulation.  HPI     Past Medical History:  Diagnosis Date   Acute cystitis 07/22/2007   ALLERGIC RHINITIS 08/14/2006   [place] DISEASE, LUMBAR 08/07/2009   DIVERTICULITIS, ACUTE 06/07/2008   Extrinsic asthma, unspecified 03/30/2007   GERD 08/14/2006   HEPATITIS B, HX OF 08/14/2006   HYPERLIPIDEMIA 07/01/2008   HYPERTENSION 08/14/2006   OSTEOARTHRITIS 08/14/2006   PILAR CYST 07/20/2008   ROTATOR CUFF INJURY, RIGHT SHOULDER 11/11/2008    Patient Active Problem List   Diagnosis Date Noted   Migraine headache without aura 11/07/2017   Overweight (BMI 25.0-29.9) 10/24/2017   Laceration of left lower extremity 12/18/2015   Insomnia 07/04/2014   Pain in right ankle 10/30/2010   Keratosis 08/27/2010   Hyperlipidemia 07/01/2008   Essential hypertension 08/14/2006   Allergic rhinitis 08/14/2006   GERD 08/14/2006   OSTEOARTHRITIS 08/14/2006   HEPATITIS B, HX OF 08/14/2006    Past Surgical History:  Procedure Laterality Date   ABDOMINAL HYSTERECTOMY     childbirth     X 2   CHOLECYSTECTOMY       OB History   No obstetric history on file.     Family History  Problem Relation Age of Onset   Hyperlipidemia  Other    Hypertension Other    Anuerysm Mother 25       healthy weight, exercise and lifestyle    Heart failure Father 25   Cerebral aneurysm Brother     Social History   Tobacco Use   Smoking status: Never   Smokeless tobacco: Never  Substance Use Topics   Alcohol use: No    Home Medications Prior to Admission medications   Medication Sig Start Date End Date Taking? Authorizing Provider  atorvastatin (LIPITOR) 40 MG tablet Take 40 mg by mouth at bedtime. 12/31/19   [provider]  Calcium-Magnesium-Vitamin D (CALCIUM 500 PO) Take by mouth.    [provider]  fish oil-omega-3 fatty acids 1000 MG capsule Take 1 g by mouth daily.    [provider]  fluticasone (FLONASE) 50 MCG/ACT nasal spray Place into both nostrils daily. Patient not taking: No sig reported    [provider]  hydrochlorothiazide (HYDRODIURIL) 25 MG tablet Take 1 tablet (25 mg total) by mouth daily. 10/24/17 10/24/18  [place], Niyah G, MD  magnesium oxide (MAG-OX) 400 MG tablet Take 400 mg by mouth daily.    [provider]  Melatonin 5 MG TABS Take by mouth.    [provider]  nortriptyline (PAMELOR) 10 MG capsule Take 1 capsule (10 mg total) by mouth at bedtime. 07/21/20   Pieter Partridge, DO  omeprazole (PRILOSEC) 20 MG capsule Take 20 mg by mouth  daily. 11/27/19   [provider]  POTASSIUM PO Take 40 mg by mouth.  Patient not taking: Reported on 07/21/2020    [provider]  ranitidine (ZANTAC) 150 MG tablet Take 150 mg by mouth daily. Patient not taking: No sig reported    [provider]  topiramate (TOPAMAX) 25 MG tablet Take 1 tablet (25 mg total) by mouth at bedtime. Patient not taking: No sig reported 11/07/17   [place], Phylliss G, MD  Zoster Vaccine Adjuvanted [facility]) injection 0.5 ml in muscle and repeat in 8 weeks Patient not taking: No sig reported 10/24/17   [place], Promiss G, MD    Allergies    Bactrim  [sulfamethoxazole-trimethoprim]  Review of Systems   Review of Systems  Constitutional: Negative.   HENT:  Positive for nosebleeds.   Eyes: Negative.   Respiratory: Negative.    Cardiovascular: Negative.   Gastrointestinal: Negative.   Genitourinary: Negative.   Musculoskeletal: Negative.   Skin: Negative.   Neurological: Negative.    Physical Exam Updated Vital Signs BP (!) 155/91 (BP Location: Left Arm)   Pulse 85   Temp 97.9 F (36.6 C) (Oral)   Resp 16   SpO2 100%   Physical Exam Vitals and nursing note reviewed.  Constitutional:      Appearance: She is not toxic-appearing.  HENT:     Head: Normocephalic and atraumatic.     Right Ear: There is hemotympanum.     Left Ear: Tympanic membrane normal.     Nose: No congestion.     Right Nostril: Epistaxis present. No septal hematoma.     Left Nostril: Epistaxis present.     Mouth/Throat:     Mouth: Mucous membranes are moist.     Pharynx: Oropharynx is clear. Uvula midline. No oropharyngeal exudate or posterior oropharyngeal erythema.     Tonsils: No tonsillar exudate.  Eyes:     General: Lids are normal. Vision grossly intact.        Right eye: No discharge.        Left eye: No discharge.     Extraocular Movements: Extraocular movements intact.     Conjunctiva/sclera: Conjunctivae normal.     Pupils: Pupils are equal, round, and reactive to light.  Neck:     Trachea: Trachea and phonation normal.  Cardiovascular:     Rate and Rhythm: Regular rhythm. Tachycardia present.     Pulses: Normal pulses.     Heart sounds: Normal heart sounds. No murmur heard. Pulmonary:     Effort: Pulmonary effort is normal. No tachypnea, bradypnea, accessory muscle usage, prolonged expiration or respiratory distress.     Breath sounds: Normal breath sounds. No wheezing or rales.  Chest:     Chest wall: No mass, lacerations, deformity, swelling, tenderness, crepitus or edema.  Abdominal:     General: Bowel sounds are normal. There is  no distension.     Palpations: Abdomen is soft.     Tenderness: There is no abdominal tenderness. There is no right CVA tenderness, left CVA tenderness, guarding or rebound.  Musculoskeletal:        General: No deformity.     Cervical back: Normal range of motion and neck supple. No edema, rigidity or crepitus. No pain with movement, spinous process tenderness or muscular tenderness.     Right lower leg: No edema.     Left lower leg: No edema.  Lymphadenopathy:     Cervical: No cervical adenopathy.  Skin:    General: Skin is  warm and dry.     Capillary Refill: Capillary refill takes less than 2 seconds.  Neurological:     General: No focal deficit present.     Mental Status: She is alert and oriented to person, place, and time. Mental status is at baseline.  Psychiatric:        Mood and Affect: Mood normal.    ED Results / Procedures / Treatments   Labs (all labs ordered are listed, but only abnormal results are displayed) Labs Reviewed  CBC WITH DIFFERENTIAL/PLATELET - Abnormal; Notable for the following components:      Result Value   WBC 12.1 (*)    Platelets 418 (*)    Neutro Abs 8.1 (*)    Abs Immature Granulocytes 0.14 (*)    All other components within normal limits  COMPREHENSIVE METABOLIC PANEL - Abnormal; Notable for the following components:   Glucose, Bld 122 (*)    Albumin 3.1 (*)    All other components within normal limits  PROTIME-INR    EKG None  Radiology No results found.  Procedures Procedures   Medications Ordered in ED Medications  sodium chloride 0.9 % bolus 500 mL (0 mLs Intravenous Stopped 09/16/20 1037)  oxymetazoline (AFRIN) 0.05 % nasal spray 1 spray (1 spray Each Nare Given 09/16/20 1036)  lidocaine-EPINEPHrine (XYLOCAINE W/EPI) 2 %-1:200000 (PF) injection ( Alternating Nares Given 09/16/20 1051)    ED Course  I have reviewed the triage vital signs and the nursing notes.  Pertinent labs & imaging results that were available during my  care of the patient were reviewed by me and considered in my medical decision making (see chart for details).    MDM Rules/Calculators/A&P                         80 year old female presents with concern for spontaneous epistaxis that started this morning around 8:00.  She is not anticoagulated.  Differential diagnosis includes anterior posterior epistaxis.  Hypertensive and tachycardic to the 130s on intake.  On my exam she remains tachycardic to the 120s.  Cardiopulmonary exam is normal, abdominal exam is benign.  HEENT exam did reveal epistaxis from bilateral nares, R>L.  Unable to identify source of bleeding due to quantity of clots present in the naris bilaterally.  Blood clots in back of throat on oral exam.  Additionally right hemotympanum, likely from reflux of blood of the eustachian tube.  No anterior neck swelling or edema.  Initially bleeding resolved on its own, however subsequently did start up again. Afrin administered tenderness bilaterally with brief pause in bleeding from the right nare and complete resolution of bleeding from the left nare.  Pledgets soaked in lidocaine with epinephrine were then administered to the right nostril with brief resolution of bleeding, however it did return again after about 10 minutes.  These were removed and repeat nasal exam which revealed suspected source of bleeding in the proximal but anterior blood supply, medially along the septum without septal hematoma bilaterally.  Rhino Rocket administered with complete resolution and epistaxis.  Patient longer tasting blood in the back of her throat.  Has not been nauseated or had any episodes of emesis in the emergency department.  Basic labs were obtained due to tachycardia, CBC with mild status of 12, and reassuring hemoglobin of 13.  PT/INR normal with INR of 1.  CMP unremarkable.  EKG which did reveal sinus tachycardia without ischemic changes.  Patient remains tachycardic to the 120s at  this time  despite complete resolution of her bleeding and a bolus with 500 mL of normal saline.  We will bolus another liter of fluids and reevaluate her heart rate.  Patient remains tachycardic to the 120s despite 1500 mL fluid bolus.  Remains asymptomatic at this time.  They discussed with the patient that I recommend rechecking her H&H and administering another fluid liter bolus and then reevaluating her heart rate in the emergency department.  Desiree Spencer voiced understanding of this recommendation and expressed wishes to proceed with discharge at this time.  She states that she feels well and does not feel she needs to be in the emergency department this time.  Is not short of breath, lightheaded, or have any chest pain.  She is no longer bleeding or tasting blood in her throat.  Will disagree with this disposition plan, patient does appear otherwise stable.  Discussed at length strict return precautions which Desiree Spencer voiced understanding of; will have her follow up with ENT next week for rhinorocket removal.   Desiree Spencer voiced understanding of her medical evaluation and treatment plan.  Each of her questions was answered to her expressed satisfaction.  Strict return precautions given.  Patient is stable at this time.  This chart was dictated using voice recognition software, Dragon. Despite the best efforts of this provider to proofread and correct errors, errors may still occur which can change documentation meaning.  Final Clinical Impression(s) / ED Diagnoses Final diagnoses:  None    Rx / DC Orders ED Discharge Orders     None        Aura Dials 09/16/20 1554    Daleen Bo, MD 09/16/20 [phone]

## 2020-09-16 NOTE — ED Provider Notes (Signed)
  Face-to-face evaluation   History: She reports onset of nasal bleeding, unclear which side, after she woke this morning.  She is currently taking amoxicillin for sinus infection and has been "blowing my nose a lot."  No previous similar problem.  She does not take anticoagulants.  She denies fever, weakness or dizziness.  Physical exam: Elderly female alert and cooperative.  She has mild active nasal bleeding from the right side.  She does not appear to be in distress.  Medical screening examination/treatment/procedure(s) were conducted as a shared visit with non-physician practitioner(s) and myself.  I personally evaluated the patient during the encounter    Daleen Bo, MD 09/16/20 1659

## 2020-09-16 NOTE — Discharge Instructions (Addendum)
You were seen in the ER today for your nosebleed.  Multiple methods of stopping your bleeding were attempted, however without success.  For that reason packing was placed in your nose with a Rhino Rocket.  This will stay in your nose for 5 days.  Please follow-up with Dr. Redmond Baseman, the otolaryngologist listed below.  We will likely remove Rhino Rocket next Thursday or Friday.  Please call their office first thing on Monday morning to schedule follow-up appointment.  Your heart rate remained elevated throughout your stay in the emergency department.  It was recommended that you remain in the emergency department for reevaluation of your hemoglobin and further IV fluids, however you declined stating that you feel well.  Please be close attention to your heart rate at home this evening.  If it remains elevated I would strongly recommend that you return to the emergency department for further evaluation.  Return to the ER if develop any chest pain, shortness of breath palpitations, lightheadedness, you pass out, or develop any new bleeding around the packing in your nose or any other new severe symptoms.

## 2020-09-16 NOTE — ED Triage Notes (Signed)
EMS reports from home, continuing Epistaxis since approx 40 minutes ago. Denies blood thinners, denies pain or any  other symptom. Pt states she has been blowing her nose due to nasal congestion x 2 weeks. Taking ABX for strep throat. Given two sprays of Afrin enroute with some result.

## 2020-09-19 ENCOUNTER — Emergency Department (HOSPITAL_COMMUNITY)
Admission: EM | Admit: 2020-09-19 | Discharge: 2020-09-19 | Disposition: A | Discharge status: Home / Self Care | Payer: Medicare Other | Attending: Student | Admitting: Student

## 2020-09-19 ENCOUNTER — Other Ambulatory Visit: Payer: Self-pay

## 2020-09-19 ENCOUNTER — Encounter (HOSPITAL_COMMUNITY): Payer: Self-pay

## 2020-09-19 DIAGNOSIS — Z79899 Other long term (current) drug therapy: Secondary | ICD-10-CM | POA: Diagnosis not present

## 2020-09-19 DIAGNOSIS — R040 Epistaxis: Secondary | ICD-10-CM | POA: Insufficient documentation

## 2020-09-19 DIAGNOSIS — I10 Essential (primary) hypertension: Secondary | ICD-10-CM | POA: Diagnosis not present

## 2020-09-19 LAB — CBC WITH DIFFERENTIAL/PLATELET
Abs Immature Granulocytes: 0.16 10*3/uL — ABNORMAL HIGH (ref 0.00–0.07)
Basophils Absolute: 0.1 10*3/uL (ref 0.0–0.1)
Basophils Relative: 1 %
Eosinophils Absolute: 0.3 10*3/uL (ref 0.0–0.5)
Eosinophils Relative: 2 %
HCT: 35.4 % — ABNORMAL LOW (ref 36.0–46.0)
Hemoglobin: 11.5 g/dL — ABNORMAL LOW (ref 12.0–15.0)
Immature Granulocytes: 1 %
Lymphocytes Relative: 24 %
Lymphs Abs: 3.7 10*3/uL (ref 0.7–4.0)
MCH: 27.8 pg (ref 26.0–34.0)
MCHC: 32.5 g/dL (ref 30.0–36.0)
MCV: 85.5 fL (ref 80.0–100.0)
Monocytes Absolute: 0.9 10*3/uL (ref 0.1–1.0)
Monocytes Relative: 5 %
Neutro Abs: 10.8 10*3/uL — ABNORMAL HIGH (ref 1.7–7.7)
Neutrophils Relative %: 67 %
Platelets: 460 10*3/uL — ABNORMAL HIGH (ref 150–400)
RBC: 4.14 MIL/uL (ref 3.87–5.11)
RDW: 14.5 % (ref 11.5–15.5)
WBC: 15.9 10*3/uL — ABNORMAL HIGH (ref 4.0–10.5)
nRBC: 0 % (ref 0.0–0.2)

## 2020-09-19 LAB — CBG MONITORING, ED: Glucose-Capillary: 147 mg/dL — ABNORMAL HIGH (ref 70–99)

## 2020-09-19 LAB — PROTIME-INR
INR: 1.1 (ref 0.8–1.2)
Prothrombin Time: 13.7 seconds (ref 11.4–15.2)

## 2020-09-19 LAB — HEMOGLOBIN AND HEMATOCRIT, BLOOD
HCT: 33.9 % — ABNORMAL LOW (ref 36.0–46.0)
Hemoglobin: 11.1 g/dL — ABNORMAL LOW (ref 12.0–15.0)

## 2020-09-19 MED ORDER — LACTATED RINGERS IV BOLUS
1000.0000 mL | Freq: Once | INTRAVENOUS | Status: AC
  Administered 2020-09-19: 1000 mL via INTRAVENOUS

## 2020-09-19 MED ORDER — OXYMETAZOLINE HCL 0.05 % NA SOLN
1.0000 | Freq: Once | NASAL | Status: AC
  Administered 2020-09-19: 1 via NASAL
  Filled 2020-09-19: qty 30

## 2020-09-19 MED ORDER — TRANEXAMIC ACID FOR EPISTAXIS
500.0000 mg | Freq: Once | TOPICAL | Status: DC
  Filled 2020-09-19: qty 10

## 2020-09-19 MED ORDER — SODIUM CHLORIDE 0.9 % IV BOLUS: 1000.0000 mL | Freq: Once | INTRAVENOUS | Status: DC

## 2020-09-19 NOTE — ED Triage Notes (Signed)
Pt BIB [facility] from home c/o a nosebleed. Initially it started Saturday night and pt was seen at [facility] and had a balloon placed in the right nostril and was told to follow up with ENT on Thursday this week. Since then nose has been bleeding on and off and now pt is bleeding from the left side with bleeding currently being controlled. Pt has been tachy with EMS but stated she has been that way since Saturday.

## 2020-09-19 NOTE — ED Notes (Signed)
This RN went to discharge the pt and while the pt was getting dressed and became super dizzy. Pt began to wobble, pt's husband caught pt and lowered her to the bed. This RN notified EDP.

## 2020-09-19 NOTE — Consult Note (Signed)
ENT CONSULT:  Reason for Consult: Epistaxis  Referring Physician:  Dr. Matilde Sprang  HPI: Desiree Spencer is an 80 y.o. female who presents to the ED with breakthrough epistaxis despite rhino rocket in right nare. Patient initially presented to [facility] ED on 07/30 with right sided epistaxis and was packed at that time by ED physician, with adequate control. Patient states bleeding resumed yesterday and has increased in severity, with blood coming from bilateral nares and mouth. She is not on any bloodthinners, denies recent history of nasal trauma. Hgb in ED 11.5.  Past Medical History:  Diagnosis Date   Acute cystitis 07/22/2007   ALLERGIC RHINITIS 08/14/2006   [place] DISEASE, LUMBAR 08/07/2009   DIVERTICULITIS, ACUTE 06/07/2008   Extrinsic asthma, unspecified 03/30/2007   GERD 08/14/2006   HEPATITIS B, HX OF 08/14/2006   HYPERLIPIDEMIA 07/01/2008   HYPERTENSION 08/14/2006   OSTEOARTHRITIS 08/14/2006   PILAR CYST 07/20/2008   ROTATOR CUFF INJURY, RIGHT SHOULDER 11/11/2008    Past Surgical History:  Procedure Laterality Date   ABDOMINAL HYSTERECTOMY     childbirth     X 2   CHOLECYSTECTOMY      Family History  Problem Relation Age of Onset   Hyperlipidemia Other    Hypertension Other    Anuerysm Mother 55       healthy weight, exercise and lifestyle    Heart failure Father 20   Cerebral aneurysm Brother     Social History:  reports that she has never smoked. She has never used smokeless tobacco. She reports that she does not drink alcohol. No history on file for drug use.  Allergies:  Allergies  Allergen Reactions   Bactrim [Sulfamethoxazole-Trimethoprim] Hives    Medications: I have reviewed the patient's current medications.  Results for orders placed or performed during the hospital encounter of 09/19/20 (from the past 48 hour(s))  CBC with Differential     Status: Abnormal   Collection Time: 09/19/20 12:07 PM  Result Value Ref Range   WBC 15.9 (H) 4.0 - 10.5 K/uL   RBC 4.14 3.87 -  5.11 MIL/uL   Hemoglobin 11.5 (L) 12.0 - 15.0 g/dL   HCT 35.4 (L) 36.0 - 46.0 %   MCV 85.5 80.0 - 100.0 fL   MCH 27.8 26.0 - 34.0 pg   MCHC 32.5 30.0 - 36.0 g/dL   RDW 14.5 11.5 - 15.5 %   Platelets 460 (H) 150 - 400 K/uL   nRBC 0.0 0.0 - 0.2 %   Neutrophils Relative % 67 %   Neutro Abs 10.8 (H) 1.7 - 7.7 K/uL   Lymphocytes Relative 24 %   Lymphs Abs 3.7 0.7 - 4.0 K/uL   Monocytes Relative 5 %   Monocytes Absolute 0.9 0.1 - 1.0 K/uL   Eosinophils Relative 2 %   Eosinophils Absolute 0.3 0.0 - 0.5 K/uL   Basophils Relative 1 %   Basophils Absolute 0.1 0.0 - 0.1 K/uL   Immature Granulocytes 1 %   Abs Immature Granulocytes 0.16 (H) 0.00 - 0.07 K/uL    Comment: Performed at [facility], [address], [place], [place] [address]  Protime-INR     Status: None   Collection Time: 09/19/20 12:07 PM  Result Value Ref Range   Prothrombin Time 13.7 11.4 - 15.2 seconds   INR 1.1 0.8 - 1.2    Comment: (NOTE) INR goal varies based on device and disease states. Performed at [facility], [place] [address], [place],  [address]  No results found.  ROS:ROS  Blood pressure 114/72, pulse (!) 130, temperature 98.1 F (36.7 C), temperature source Oral, resp. rate 18, height '5\' 4"'$  (1.626 m), weight 67.1 kg, SpO2 95 %.  PHYSICAL EXAM: CONSTITUTIONAL: well developed, nourished, in mild distress and alert and oriented x 3 PULMONARY/CHEST WALL: effort normal and no stridor, no stertor, no dysphonia HENT: Head : normocephalic and atraumatic Ears: Right ear:   canal normal, external ear normal and hearing normal Left ear:   canal normal, external ear normal and hearing normal Nose: Bleeding from right nare, no bleeding from caudal septum. No bleeding from left nare Mouth/Throat:  Mouth: uvula midline and no oral lesions Throat: Copious clot suctioned from oropharynx  Mucous membranes: normal EYES: conjunctiva normal, EOM normal and PERRL. NECK: supple, trachea  normal  Studies Reviewed:None  Assessment/Plan: Desiree Spencer is a 80 y/o F with no recent history of nasal trauma presenting with bilateral epistaxis despite 4.5 rhino rocket placed in right nare by ED on 07/30 -Bleeding controlled with application of [place] and 7.5 rapid rhino in right nare.  -Advised maintaining nasal packing in place for 5 days. Patient can follow up in our office for packing removal on Monday -No nose blowing, no nasal manipulation, no bending over or straining while packing in place -Stable for discharge from ED pending continued hemostasis   Thank you for allowing me to participate in the care of this patient. Please do not hesitate to contact me with any questions or concerns.   Jason Coop, [facility] Cell: [phone]   09/19/2020, 1:36 PM

## 2020-09-19 NOTE — Discharge Instructions (Addendum)
You were seen in the emergency department for evaluation of a nosebleed.  The ENT doctors evaluated the bleed and currently have it well controlled with a Rhino Rocket.  This is to be removed at a scheduled outpatient ENT appointment on Monday.  They will call to confirm your appointment.  Please return the emergency department if you have rebleeding from the nose, chest pain, shortness of breath, abdominal pain, vomiting, fever or any other concerning symptoms.

## 2020-09-19 NOTE — ED Provider Notes (Signed)
[facility] EMERGENCY DEPARTMENT Provider Note   CSN: [id number] Arrival date & time: 09/19/20  1156     History Chief Complaint  Patient presents with   Epistaxis    Desiree Spencer is a 80 y.o. female who presents to the emergency department for evaluation of epistaxis.  Patient was seen on 09/16/2020 with a fairly significant episode of right-sided greater than left-sided epistaxis where a Rhino Rocket was placed and ultimately the patient was discharged with ENT follow-up tomorrow for [employer] removal.  Unfortunately, the patient began to rebleed again this morning and presents with bleeding around the [facility] on the right, bleeding from the medial canthus on the right and bleeding from the left nare.  She states that she has been attempting to control the bleeding with direct pressure at home but has been unable to do so.  She denies chest pain, shortness of breath, abdominal pain, nausea, vomiting, dizziness, lightheadedness or other systemic symptoms.   Epistaxis Associated symptoms: no cough, no fever and no sore throat       Past Medical History:  Diagnosis Date   Acute cystitis 07/22/2007   ALLERGIC RHINITIS 08/14/2006   [place] DISEASE, LUMBAR 08/07/2009   DIVERTICULITIS, ACUTE 06/07/2008   Extrinsic asthma, unspecified 03/30/2007   GERD 08/14/2006   HEPATITIS B, HX OF 08/14/2006   HYPERLIPIDEMIA 07/01/2008   HYPERTENSION 08/14/2006   OSTEOARTHRITIS 08/14/2006   PILAR CYST 07/20/2008   ROTATOR CUFF INJURY, RIGHT SHOULDER 11/11/2008    Patient Active Problem List   Diagnosis Date Noted   Migraine headache without aura 11/07/2017   Overweight (BMI 25.0-29.9) 10/24/2017   Laceration of left lower extremity 12/18/2015   Insomnia 07/04/2014   Pain in right ankle 10/30/2010   Keratosis 08/27/2010   Hyperlipidemia 07/01/2008   Essential hypertension 08/14/2006   Allergic rhinitis 08/14/2006   GERD 08/14/2006   OSTEOARTHRITIS 08/14/2006   HEPATITIS B, HX  OF 08/14/2006    Past Surgical History:  Procedure Laterality Date   ABDOMINAL HYSTERECTOMY     childbirth     X 2   CHOLECYSTECTOMY       OB History   No obstetric history on file.     Family History  Problem Relation Age of Onset   Hyperlipidemia Other    Hypertension Other    Anuerysm Mother 24       healthy weight, exercise and lifestyle    Heart failure Father 84   Cerebral aneurysm Brother     Social History   Tobacco Use   Smoking status: Never   Smokeless tobacco: Never  Substance Use Topics   Alcohol use: No    Home Medications Prior to Admission medications   Medication Sig Start Date End Date Taking? Authorizing Provider  amoxicillin (AMOXIL) 500 MG capsule Take 500 mg by mouth 3 (three) times daily. 09/08/20  Yes [provider]  atorvastatin (LIPITOR) 40 MG tablet Take 40 mg by mouth daily with breakfast. 12/31/19  Yes [provider]  CALCIUM-MAGNESIUM-VITAMIN D PO Take 1 tablet by mouth every morning.   Yes [provider]  Aspirin-Acetaminophen-Caffeine (EXCEDRIN PO) Take 2 tablets by mouth 2 (two) times daily as needed (headache). Patient not taking: Reported on 09/19/2020    [provider]  dextromethorphan-guaiFENesin (MUCINEX DM) 30-600 MG 12hr tablet Take 1 tablet by mouth 2 (two) times daily as needed for cough.    [provider]  Levocetirizine Dihydrochloride (XYZAL PO) Take 1 tablet by mouth at  bedtime.    [provider]  magnesium oxide (MAG-OX) 400 MG tablet Take 400 mg by mouth at bedtime.    [provider]  Melatonin 10 MG TABS Take 10 mg by mouth at bedtime.    [provider]  nortriptyline (PAMELOR) 10 MG capsule Take 1 capsule (10 mg total) by mouth at bedtime. Patient taking differently: Take 10 mg by mouth every morning. 07/21/20   Pieter Partridge, DO  Omega-3 Fatty Acids (FISH OIL) 1200 MG CAPS Take 1,200 mg by mouth every morning.    [provider]   omeprazole (PRILOSEC) 20 MG capsule Take 20 mg by mouth every other day. 11/27/19   [provider]    Allergies    Nickel and Bactrim [sulfamethoxazole-trimethoprim]  Review of Systems   Review of Systems  Constitutional:  Negative for chills and fever.  HENT:  Positive for nosebleeds. Negative for ear pain and sore throat.   Eyes:  Negative for pain and visual disturbance.  Respiratory:  Negative for cough and shortness of breath.   Cardiovascular:  Negative for chest pain and palpitations.  Gastrointestinal:  Negative for abdominal pain and vomiting.  Genitourinary:  Negative for dysuria and hematuria.  Musculoskeletal:  Negative for arthralgias and back pain.  Skin:  Negative for color change and rash.  Neurological:  Negative for seizures and syncope.  All other systems reviewed and are negative.  Physical Exam Updated Vital Signs BP 120/67   Pulse (!) 126   Temp 98.1 F (36.7 C) (Oral)   Resp 15   Ht '5\' 4"'$  (1.626 m)   Wt 67.1 kg   SpO2 95%   BMI 25.40 kg/m   Physical Exam Vitals and nursing note reviewed.  Constitutional:      General: She is not in acute distress.    Appearance: She is well-developed.  HENT:     Head: Normocephalic and atraumatic.     Nose:     Comments: Persistent low-volume bleeding around the Rhino Rocket in the right nare, clots and slow anterior bleeding out of the left nare, minimal bleeding in the posterior oropharynx Eyes:     Conjunctiva/sclera: Conjunctivae normal.  Cardiovascular:     Rate and Rhythm: Regular rhythm. Tachycardia present.     Heart sounds: No murmur heard. Pulmonary:     Effort: Pulmonary effort is normal. No respiratory distress.     Breath sounds: Normal breath sounds.  Abdominal:     Palpations: Abdomen is soft.     Tenderness: There is no abdominal tenderness.  Musculoskeletal:     Cervical back: Neck supple.  Skin:    General: Skin is warm and dry.  Neurological:     Mental Status: She is  alert.    ED Results / Procedures / Treatments   Labs (all labs ordered are listed, but only abnormal results are displayed) Labs Reviewed  CBC WITH DIFFERENTIAL/PLATELET - Abnormal; Notable for the following components:      Result Value   WBC 15.9 (*)    Hemoglobin 11.5 (*)    HCT 35.4 (*)    Platelets 460 (*)    Neutro Abs 10.8 (*)    Abs Immature Granulocytes 0.16 (*)    All other components within normal limits  CBG MONITORING, ED - Abnormal; Notable for the following components:   Glucose-Capillary 147 (*)    All other components within normal limits  PROTIME-INR  HEMOGLOBIN AND HEMATOCRIT, BLOOD    EKG None  Radiology  No results found.  Procedures Procedures   Medications Ordered in ED Medications  oxymetazoline (AFRIN) 0.05 % nasal spray 1 spray (1 spray Each Nare Given 09/19/20 1254)  lactated ringers bolus 1,000 mL (1,000 mLs Intravenous New Bag/Given 09/19/20 1451)    ED Course  I have reviewed the triage vital signs and the nursing notes.  Pertinent labs & imaging results that were available during my care of the patient were reviewed by me and considered in my medical decision making (see chart for details).    MDM Rules/Calculators/A&P                           Patient seen in the emergency department for evaluation of epistaxis.  Physical exam reveals a tachycardic patient with active epistaxis from around the [facility] on the right with greater volume on the left with additional bleeding from the medial canthus on the right.  Initial hemoglobin 11.5 which is down trended from 13.2 on 09/16/2020.  I initially attempted to control the bleeding with Afrin and Afrin soaked gauze, but patient's bleeding continued.  ENT was ultimately consulted who was able to control bleeding after placing a larger Rhino Rocket on the right and using cornstarch-based bleeding control here in the ED.  The patient has been persistently tachycardic while here in the emergency  department and this has been documented in previous notes on 09/16/2020, and upon standing when attempting to discharge the patient, she had an episode of orthostatic syncope.  She requested to use the bathroom prior to fluid resuscitation at which she had another episode of orthostatic syncope.  Repeat H&H 11.1 and 1 L lactated Ringer's was administered.  On reevaluation, patient's heart rate is improving and she did not have recurrent episodes of orthostatic syncope.  Patient was then discharged with ENT follow-up on Monday for Rhino Rocket removal. Final Clinical Impression(s) / ED Diagnoses Final diagnoses:  Epistaxis    Rx / DC Orders ED Discharge Orders     None        Areatha Kalata, Debe Coder, MD 09/19/20 1526

## 2020-09-19 NOTE — ED Notes (Signed)
This RN went to get the pt out of the restroom. Pt was already sitting in the wheelchair. This RN stated to the pt we were going to get her back to the room, when pt's head dropped and she began snoring. Pt did have pulses, so this RN rushed her back to the room and with assistance helpede the pt back in the bed. Pt was out for approximately 1-2 mins. EDP notified and at bedside. New IV placed with NS running wide open. Will continue to monitor.

## 2020-09-27 ENCOUNTER — Other Ambulatory Visit: Payer: Self-pay

## 2020-09-27 ENCOUNTER — Ambulatory Visit
Admission: RE | Admit: 2020-09-27 | Discharge: 2020-09-27 | Disposition: A | Discharge status: Home / Self Care | Payer: Medicare Other | Source: Ambulatory Visit | Attending: Family Medicine | Admitting: Family Medicine

## 2020-09-27 ENCOUNTER — Other Ambulatory Visit: Payer: Self-pay | Admitting: Family Medicine

## 2020-09-27 DIAGNOSIS — R0602 Shortness of breath: Secondary | ICD-10-CM

## 2020-09-27 IMAGING — CR DG CHEST 2V
2 series · 2 of 2 positions shown · non-contrast
Comparison: [DATE]

CLINICAL DATA: Cough, shortness of breath

EXAM:
CHEST - 2 VIEW

[w chest pa]
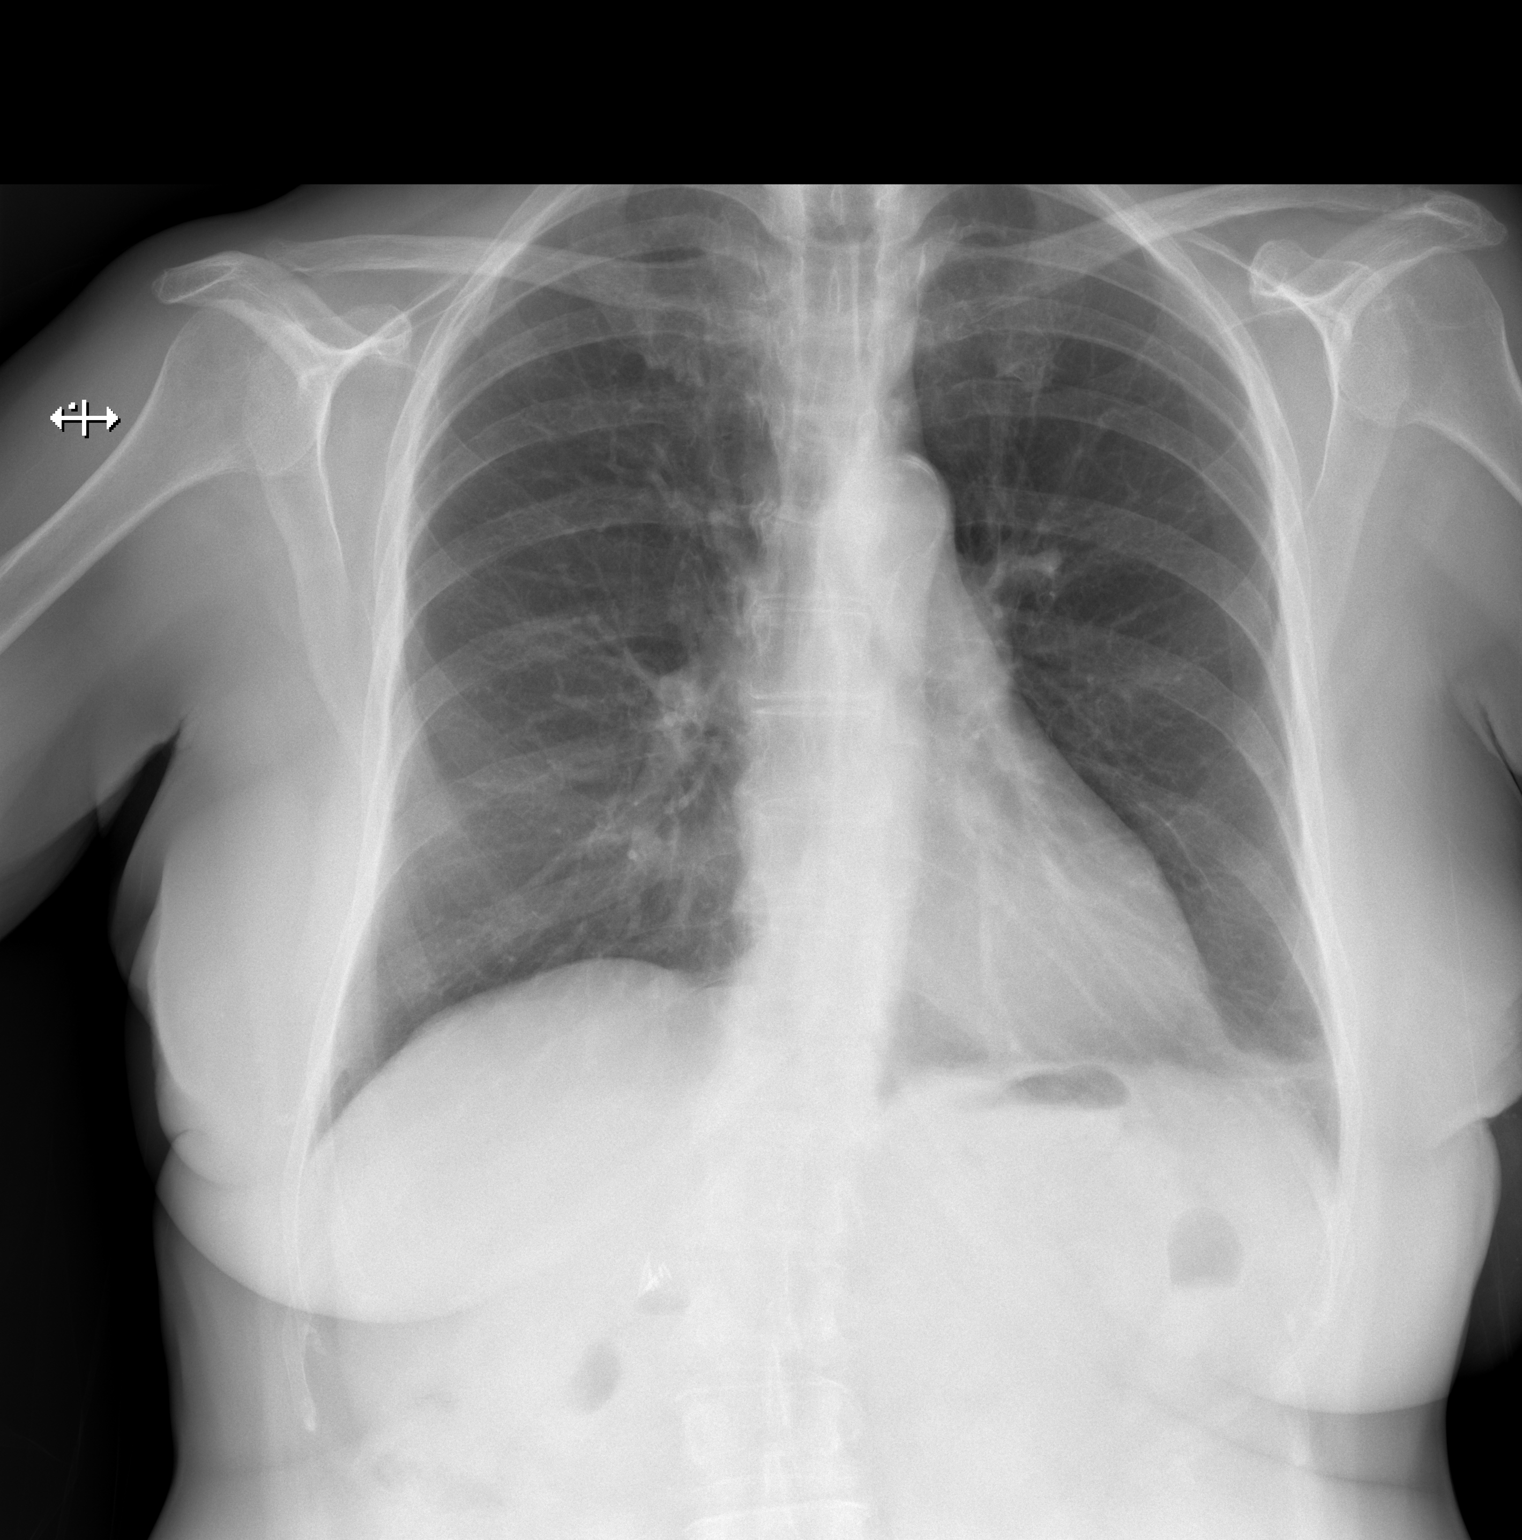

[w chest lat]
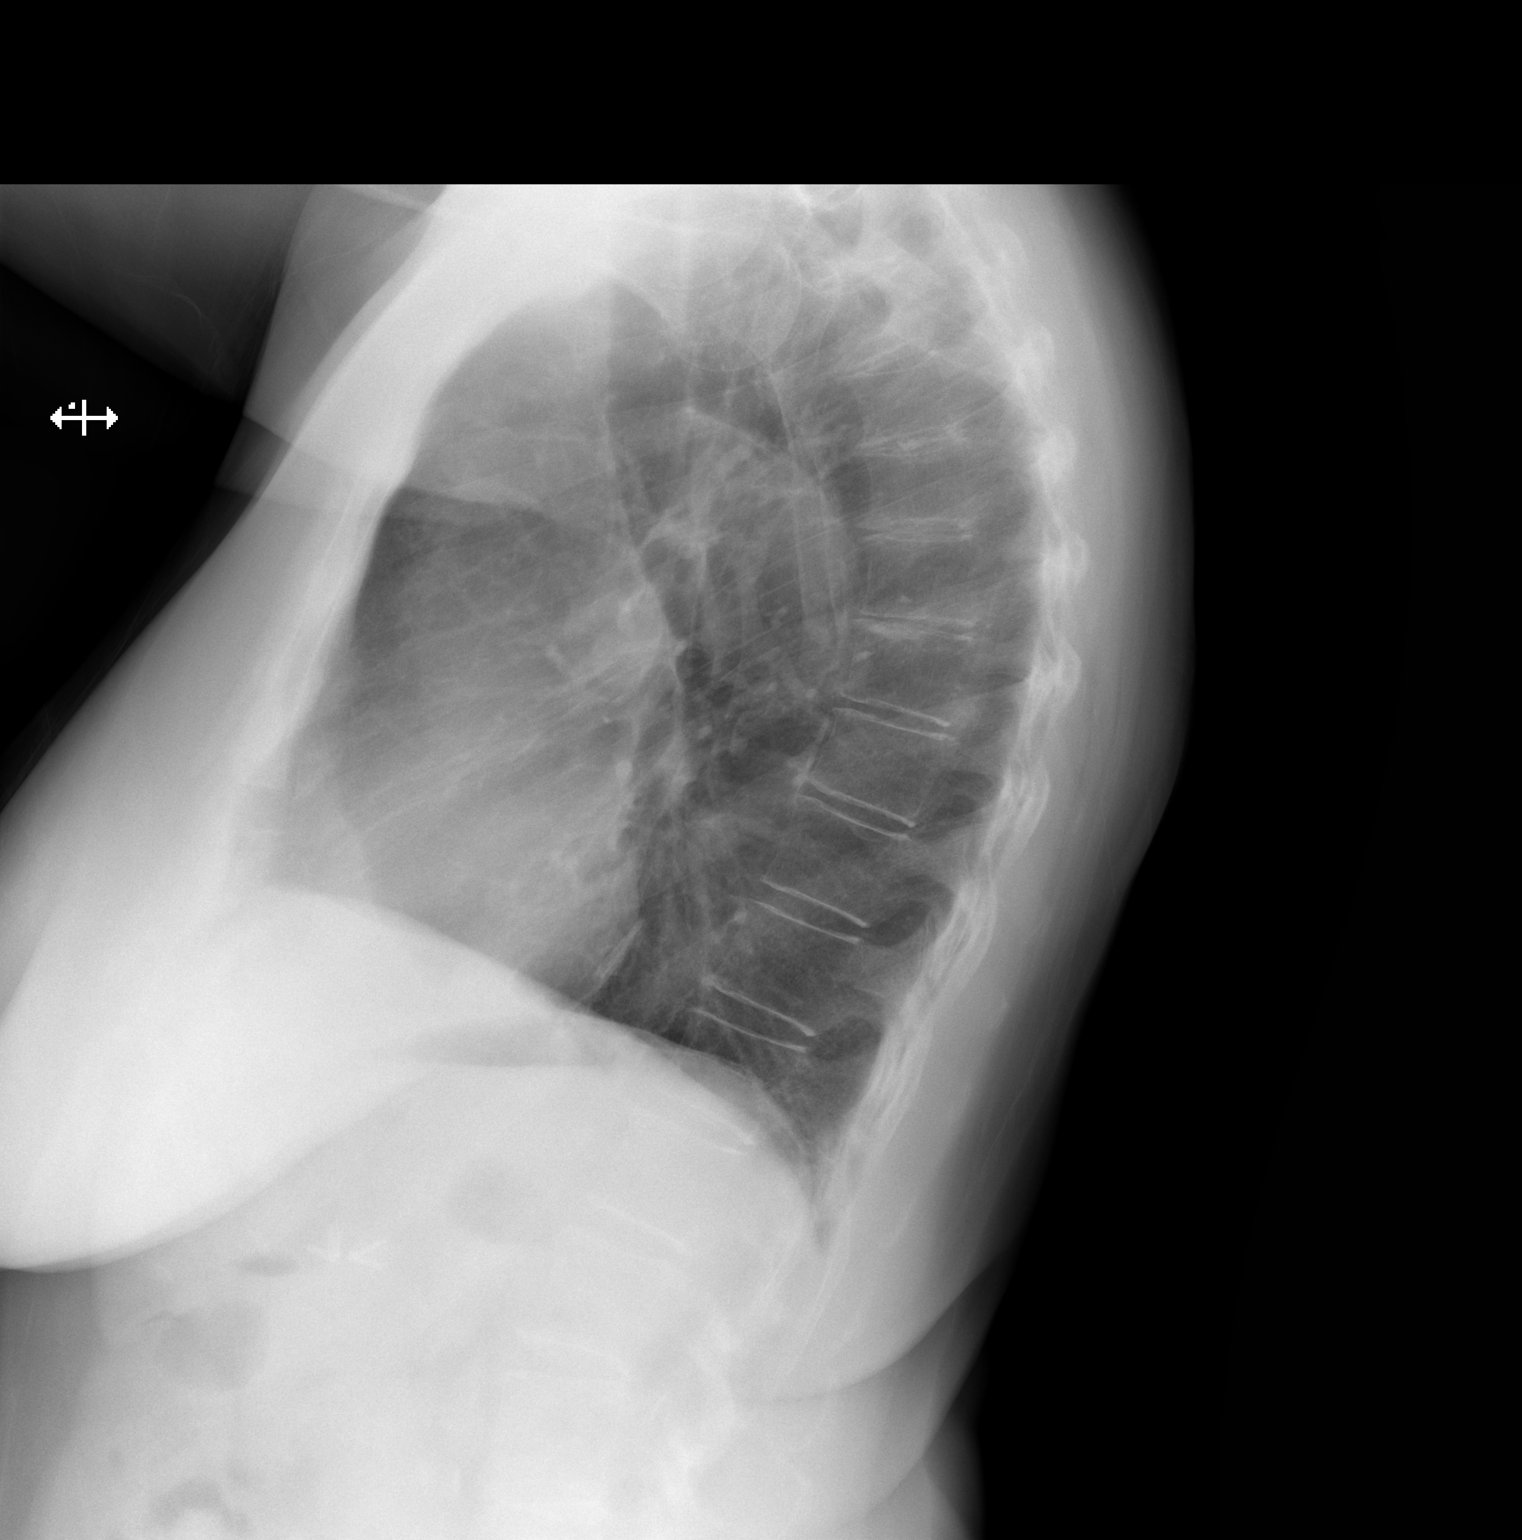

[2 of 2 positions shown; findings below may reference images not displayed]

FINDINGS: The heart size and mediastinal contours are within normal limits.
Pulmonary hyperinflation. The visualized skeletal structures are
unremarkable.
IMPRESSION: Pulmonary hyperinflation without acute abnormality of the lungs.

## 2020-09-29 ENCOUNTER — Other Ambulatory Visit: Payer: Self-pay

## 2020-09-29 ENCOUNTER — Other Ambulatory Visit: Payer: Self-pay | Admitting: Family Medicine

## 2020-09-29 ENCOUNTER — Ambulatory Visit
Admission: RE | Admit: 2020-09-29 | Discharge: 2020-09-29 | Disposition: A | Discharge status: Home / Self Care | Payer: Medicare Other | Source: Ambulatory Visit | Attending: Family Medicine | Admitting: Family Medicine

## 2020-09-29 DIAGNOSIS — R7989 Other specified abnormal findings of blood chemistry: Secondary | ICD-10-CM

## 2020-09-29 DIAGNOSIS — R0602 Shortness of breath: Secondary | ICD-10-CM

## 2020-09-29 IMAGING — CT CT ANGIO CHEST
2 of 8 series · 10 of 36 positions shown · IV contrast (iopamidol)
Comparison: Chest radiograph [DATE]

CLINICAL DATA: Shortness of breath, congestion, elevated D-dimer

EXAM:
CT ANGIOGRAPHY CHEST WITH CONTRAST
TECHNIQUE: Multidetector CT imaging of the chest was performed using the
standard protocol during bolus administration of intravenous
contrast. Multiplanar CT image reconstructions and MIPs were
obtained to evaluate the vascular anatomy.
CONTRAST:  75mL [LY] IOPAMIDOL ([LY]) INJECTION 76%

[Series 8: cta pulmonary 2.00 bv36 s3 · coronal · 0.60mm/px · 1 of 158 slices shown]
[im 79/158  mediastinal]
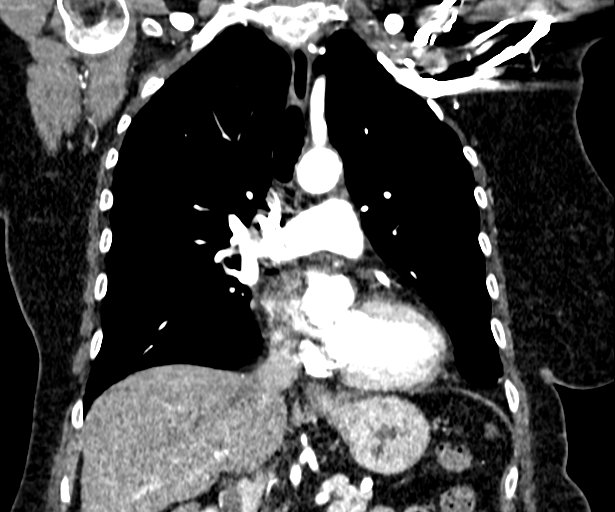

[Series 13: cta pulmonary 1.00 bv36 s3 super d. · axial · 0.73mm/px · z∈[+1541,+1787]mm · 9 of 386 slices shown]
[im 39/386  lung]
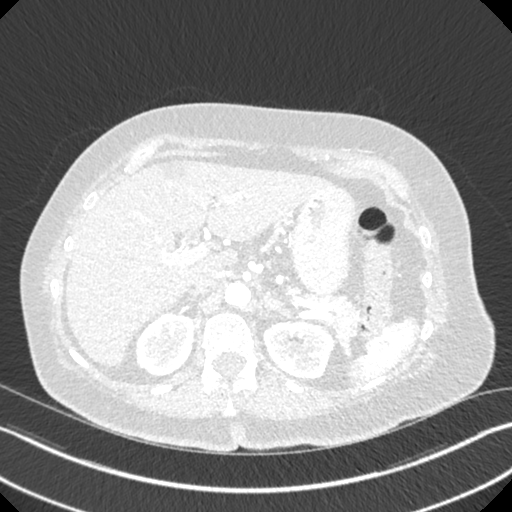
[im 78/386  mediastinal]
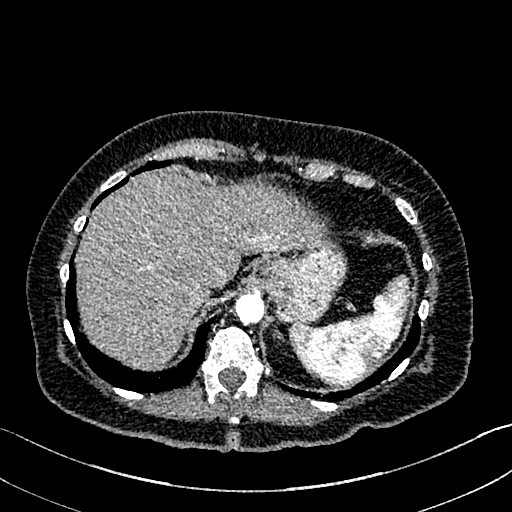
[im 116/386  lung]
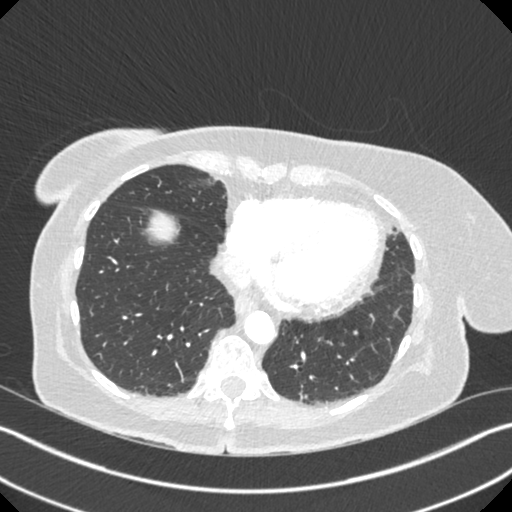
[im 155/386  mediastinal]
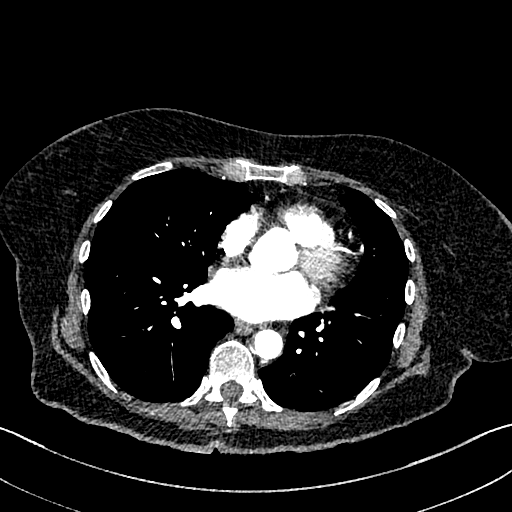
[im 193/386  lung]
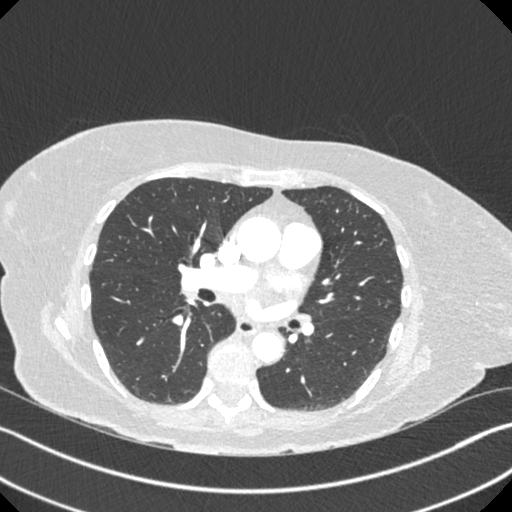
[im 232/386  mediastinal]
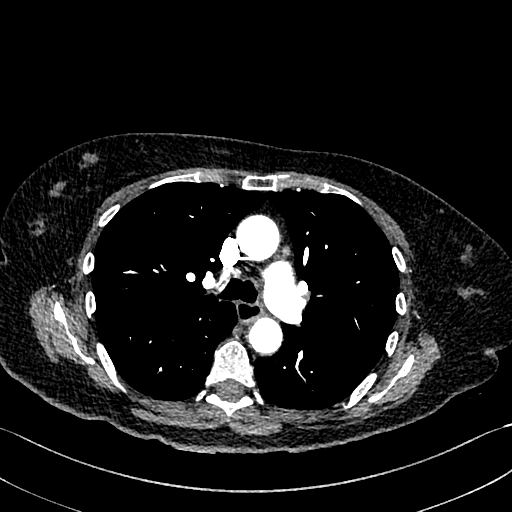
[im 270/386  lung]
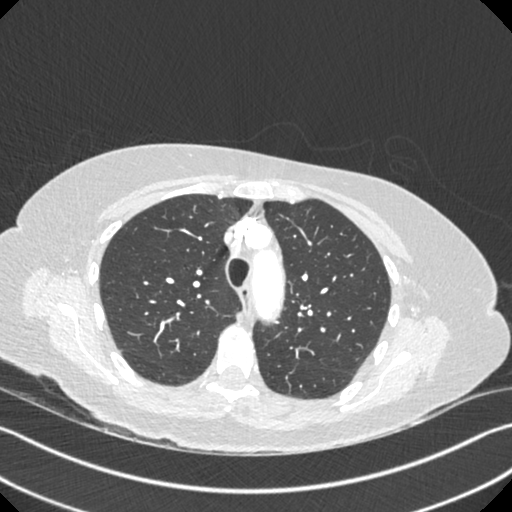
[im 309/386  mediastinal]
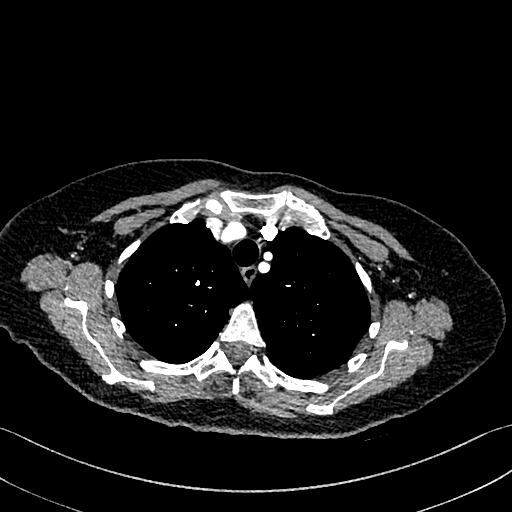
[im 347/386  lung]
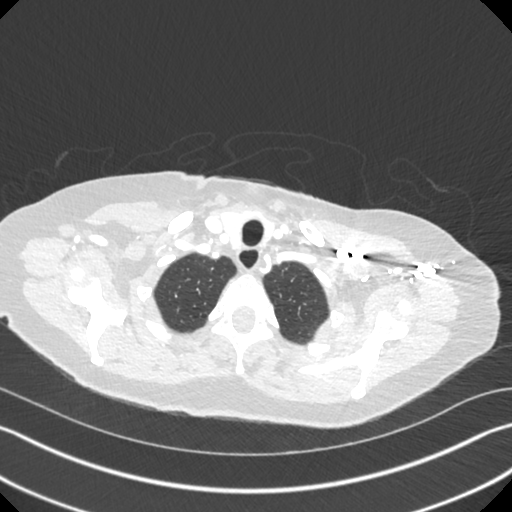

[10 of 36 positions shown; findings below may reference images not displayed]

FINDINGS: Cardiovascular: Satisfactory opacification of the pulmonary arteries
to the segmental level. No evidence of pulmonary embolism. Normal
heart size. No pericardial effusion. There is calcified
atherosclerotic plaque of the thoracic aorta.

Mediastinum/Nodes: There is no mediastinal, hilar, or axillary
lymphadenopathy. There is a 1.3 cm thyroid nodule. Not clinically
significant; no follow-up imaging recommended (ref: [HOSPITAL]. [DATE]): 143-50).

Lungs/Pleura: The trachea and central airways are patent.

The lungs are well inflated. There is no focal consolidation or
pulmonary edema. There is no pleural effusion or pneumothorax. There
is minimal dependent subsegmental atelectasis in the lung bases.

Upper Abdomen: The common bile duct is enlarged measuring up to
cm. The main pancreatic duct is prominent. There is a 1.4 cm
hypodense lesion in segment IVB of the liver.

Musculoskeletal: There is no acute osseous abnormality.

Review of the MIP images confirms the above findings.
IMPRESSION: 1. No evidence of acute or chronic pulmonary embolus.  Clear lungs.
2. Dilated common bile duct measuring up to 1.3 cm (more than
expected normally in a patient with a history of cholecystectomy)
and indeterminate hypodense lesion in hepatic segment IVB.
Recommended correlation with LFTs and nonemergent abdominal MRI with
and without contrast for further evaluation.
3. Aortic Atherosclerosis ([LY]-[LY]).

## 2020-09-29 MED ORDER — IOPAMIDOL (ISOVUE-370) INJECTION 76%
75.0000 mL | Freq: Once | INTRAVENOUS | Status: AC | PRN
  Administered 2020-09-29: 75 mL via INTRAVENOUS

## 2020-10-03 ENCOUNTER — Ambulatory Visit
Admission: RE | Admit: 2020-10-03 | Discharge: 2020-10-03 | Disposition: A | Discharge status: Home / Self Care | Payer: Medicare Other | Source: Ambulatory Visit | Attending: Family Medicine | Admitting: Family Medicine

## 2020-10-03 DIAGNOSIS — R7989 Other specified abnormal findings of blood chemistry: Secondary | ICD-10-CM

## 2020-10-03 IMAGING — US US EXTREM LOW VENOUS
1 series · 13 of 24 positions shown · non-contrast
Comparison: None.

CLINICAL DATA: 80-year-old female with a history of D-dimer



[Series 1: us extrem low venous · 0.08mm/px · 13 of 55 slices shown]
[im 1/55]
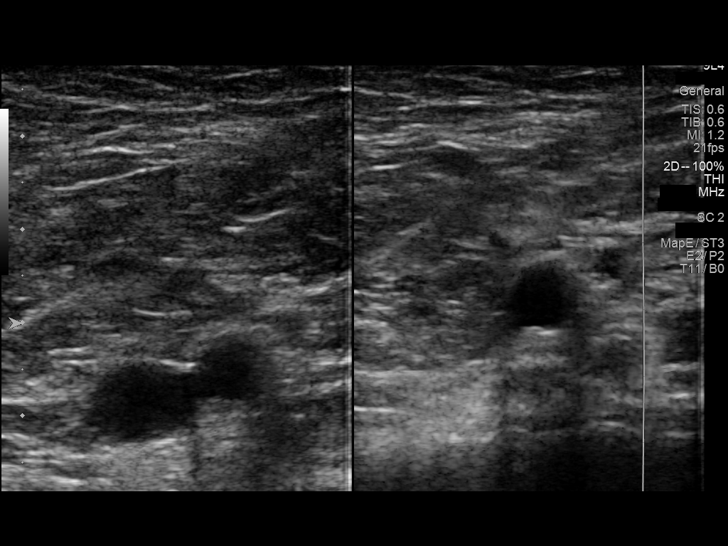
[im 5/55]
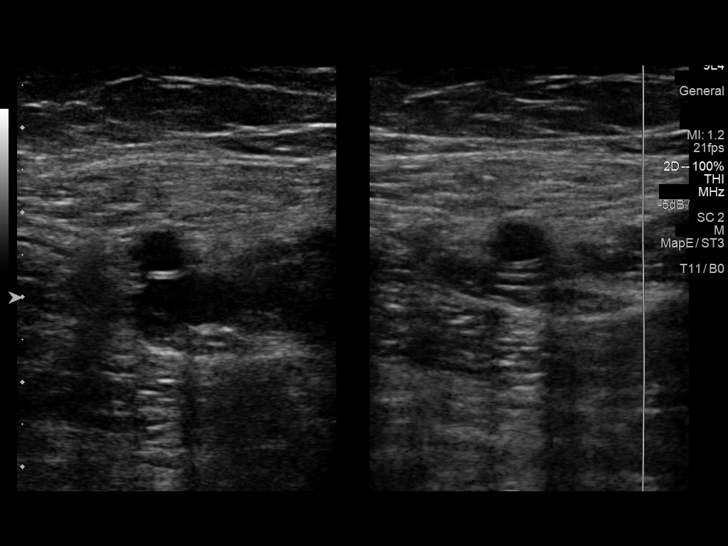
[im 10/55]
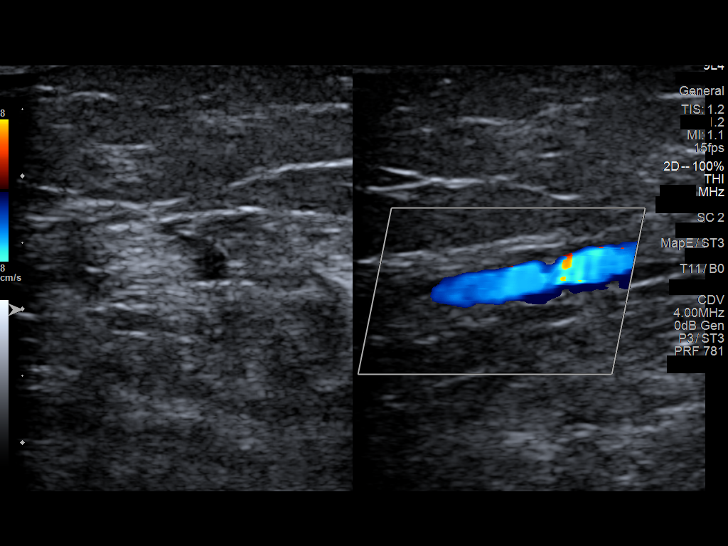
[im 15/55]
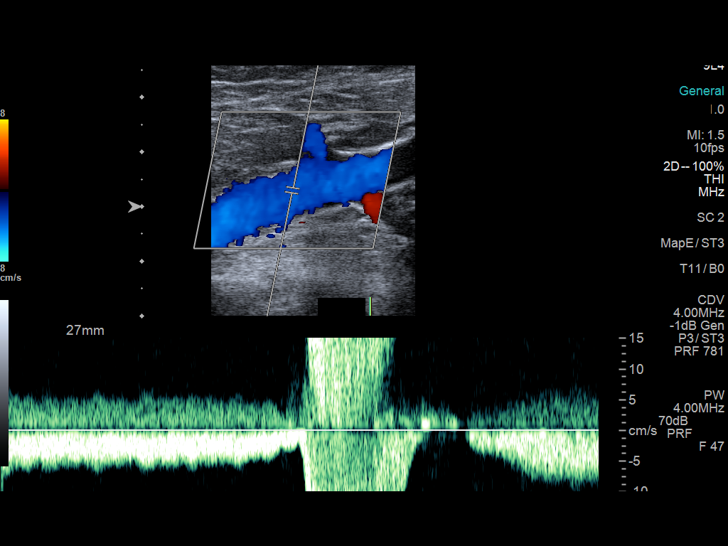
[im 19/55]
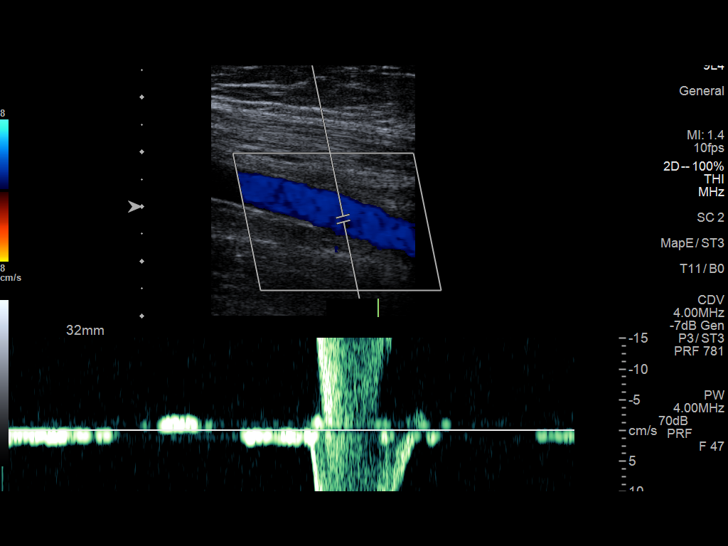
[im 24/55]
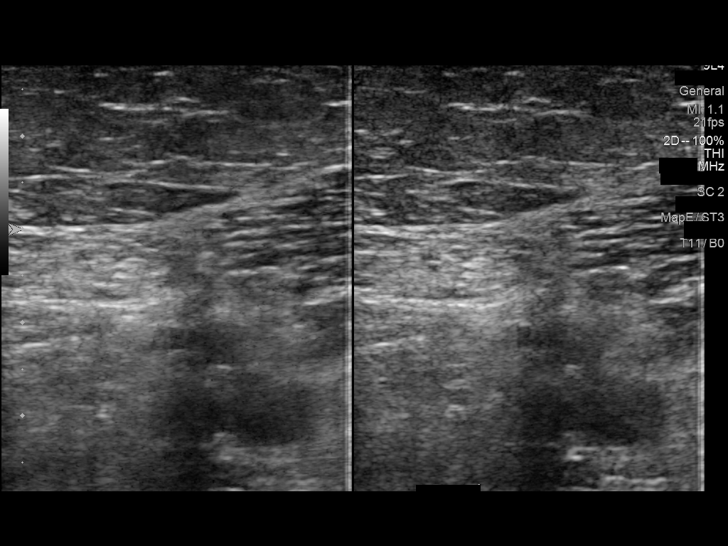
[im 29/55]
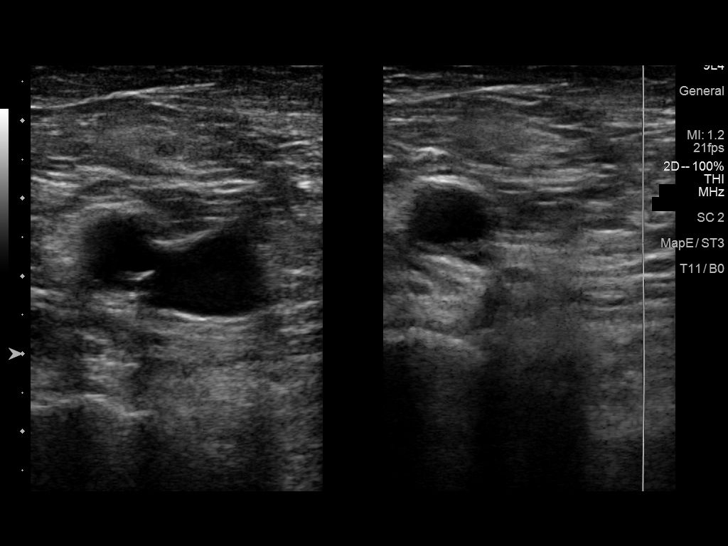
[im 31/55]
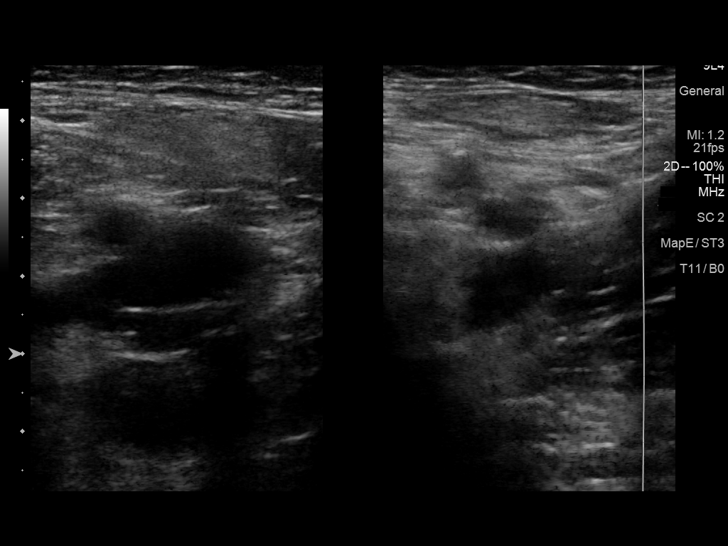
[im 36/55]
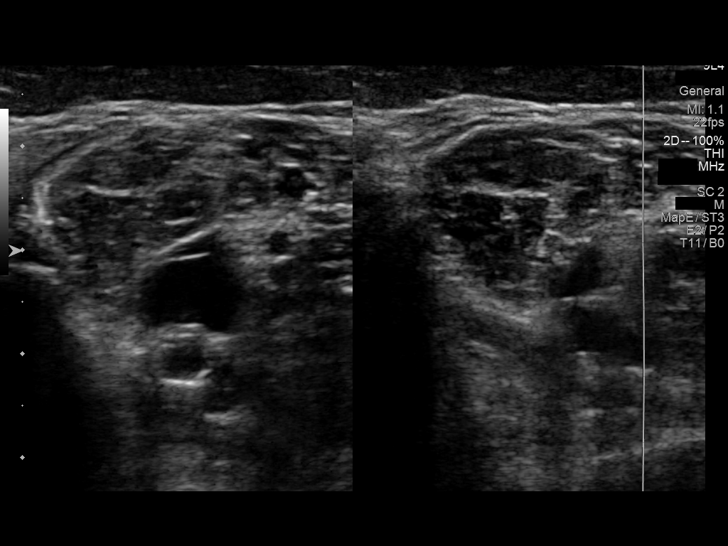
[im 40/55]
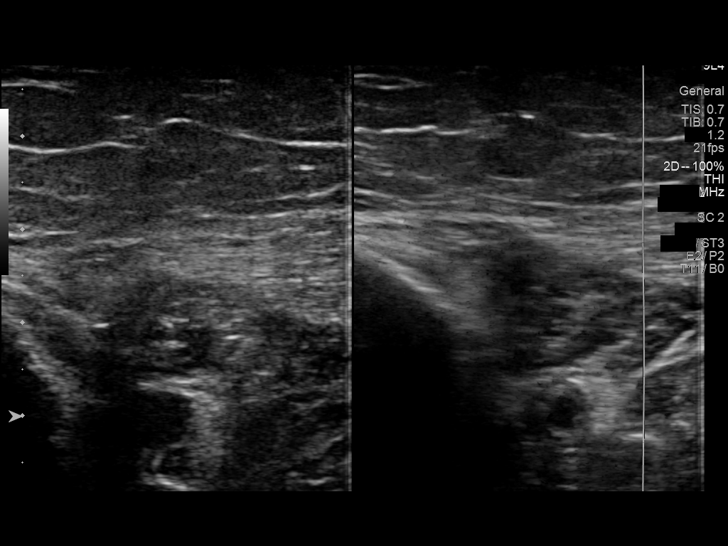
[im 45/55]
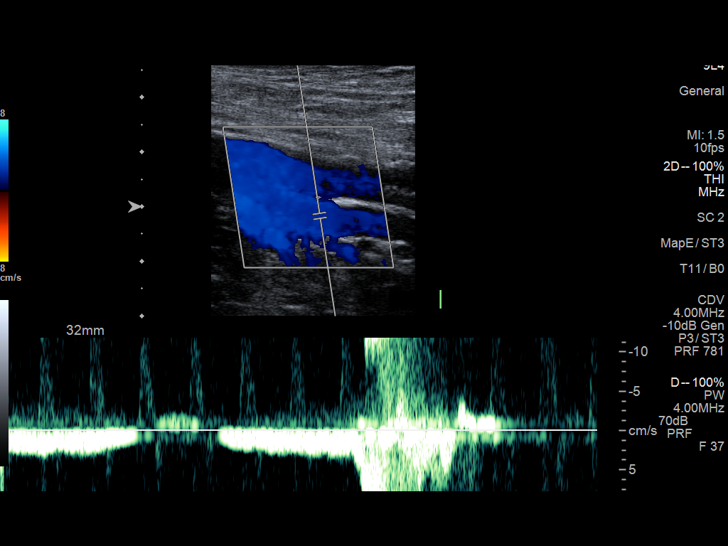
[im 50/55]
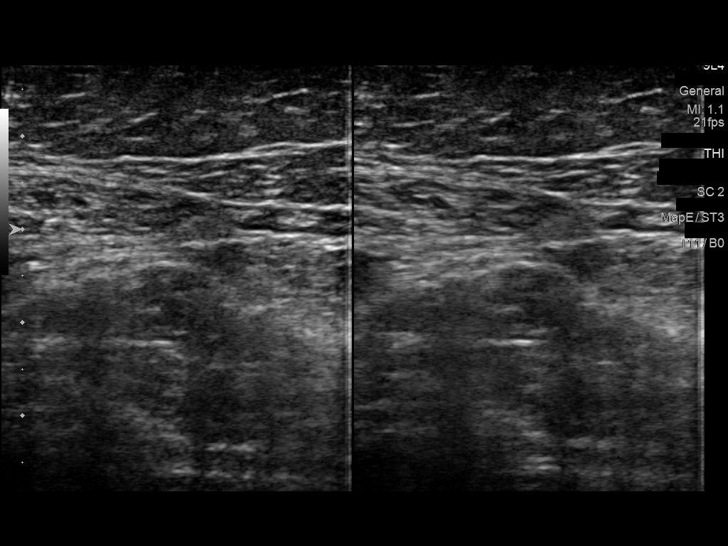
[im 55/55]
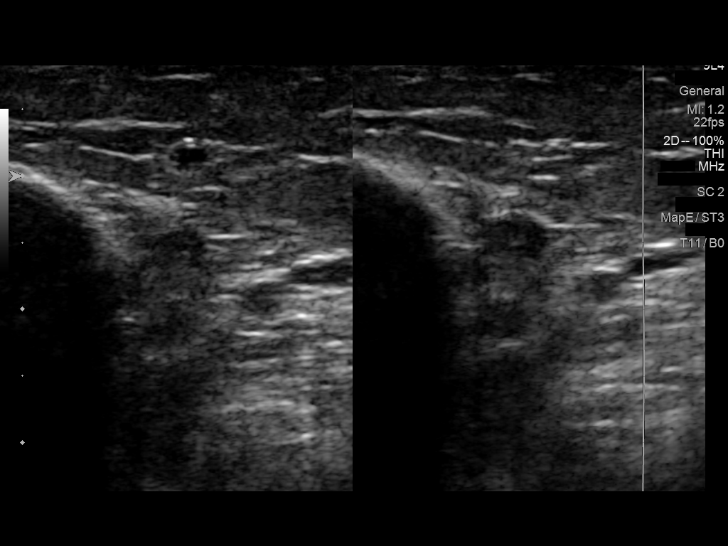

[13 of 24 positions shown; findings below may reference images not displayed]

FINDINGS: RIGHT LOWER EXTREMITY

Common Femoral Vein: No evidence of thrombus. Normal
compressibility, respiratory phasicity and response to augmentation.

Saphenofemoral Junction: No evidence of thrombus. Normal
compressibility and flow on color Doppler imaging.

Profunda Femoral Vein: No evidence of thrombus. Normal
compressibility and flow on color Doppler imaging.

Femoral Vein: No evidence of thrombus. Normal compressibility,
respiratory phasicity and response to augmentation.

Popliteal Vein: No evidence of thrombus. Normal compressibility,
respiratory phasicity and response to augmentation.

Calf Veins: Posterior tibial vein patent and compressible with no
thrombus identified. Peroneal vein not visualized.

Superficial Great Saphenous Vein: No evidence of thrombus. Normal
compressibility and flow on color Doppler imaging.

Other Findings:  None.

LEFT LOWER EXTREMITY

Common Femoral Vein: No evidence of thrombus. Normal
compressibility, respiratory phasicity and response to augmentation.

Saphenofemoral Junction: No evidence of thrombus. Normal
compressibility and flow on color Doppler imaging.

Profunda Femoral Vein: No evidence of thrombus. Normal
compressibility and flow on color Doppler imaging.

Femoral Vein: No evidence of thrombus. Normal compressibility,
respiratory phasicity and response to augmentation.

Popliteal Vein: No evidence of thrombus. Normal compressibility,
respiratory phasicity and response to augmentation.

Calf Veins: Posterior tibial vein patent and compressible with no
thrombus identified. Peroneal vein not visualized.

Superficial Great Saphenous Vein: No evidence of thrombus. Normal
compressibility and flow on color Doppler imaging.

Other Findings:  None.
IMPRESSION: Sonographic survey of the bilateral lower extremities negative for
DVT

## 2021-01-10 ENCOUNTER — Other Ambulatory Visit: Payer: Self-pay | Admitting: Family Medicine

## 2021-01-10 DIAGNOSIS — K769 Liver disease, unspecified: Secondary | ICD-10-CM

## 2021-01-25 NOTE — Progress Notes (Signed)
NEUROLOGY FOLLOW UP OFFICE NOTE  ZANYLA KLEBBA [id number]  Assessment/Plan:   Migraine without aura, without status migrainosus, not intractable  She has been migraine free for well over a year.  She is now off nortriptyline for 3 months and doing well.  If she has a recurrence of headaches, plan is to restart nortriptyline.  Otherwise, follow up as needed.   Subjective:  Euna Armon is a 80 year old right-handed female with HTN, HLD, and ostearthritis who follows up for migraines.   UPDATE: No headaches since last visit.  Stopped nortriptyline in September and still hasn't had a headache.  Current NSAIDS/analgesics:  ibuprofen Current triptans:  none Current ergotamine:  none Current anti-emetic:  none Current muscle relaxants:  none Current Antihypertensive medications:  none Current Antidepressant medications:  nortriptyline 10mg  QHS Current Anticonvulsant medications:  none Current anti-CGRP:  none Current Vitamins/Herbal/Supplements:  none Current Antihistamines/Decongestants:  none Other therapy:  none Hormone/birth control:  none   Caffeine:   No coffee.  Occasional soda Diet: sips water, decaf iced tea or Crystal Lite throughout the day Exercise:  Walk a mile daily Depression:  no; Anxiety:  no Other pain:  none Sleep hygiene:  ok   HISTORY:  She has had migraines in the 1950s.  They are severe non-throbbing aching mid frontal pain.  They are associated with some photophobia and phonophobia but no nausea, vomiting, visual disturbance, numbness or weakness.  They would occur 2 to 3 times a month.  They responded well to sumatriptan.  They eventually resolved 2011-2012.  They returned around 2019, which she attributed to starting cranberry capsules.  They resolved after she discontinued the supplement .  However, they returned in 2021 and have been frequent.  She reports 23 days of headache between November 07, 2019 and January 21, 2020.  They last anywhere  from 4 hours to the next day (8 hours on average).  She treats with ibuprofen which helps lessen the pain but does not necessarily abort it.  She cannot identify a cause for this new frequent recurrence.  She has had a routine eye exam earlier this year.  She had cataract surgery in 2020.  No new stressors, change in environment, change in medication, change in routine, or preceding head trauma or illness.  Unsure of cause for recurrence and increased frequency.  No recent head trauma, illness, new medication, change in lifestyle.  MRI of brain without contrast on 02/23/2020 was negative.   She has chronic tinnitus for over 20 years.  High-pitched tone persistent.  Usually can block out.  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       Past NSAIDS/analgesics:  none Past abortive triptans:  Sumatriptan (effective) Past abortive ergotamine:  none Past muscle relaxants:  none Past anti-emetic:  none Past antihypertensive medications:  none Past antidepressant medications:  nortriptyline 10mg  Past anticonvulsant medications:  topiramate 25mg  at bedtime (ineffective) Past anti-CGRP:  none Past vitamins/Herbal/Supplements:  none Past antihistamines/decongestants:  none Other past therapies:  none  PAST MEDICAL HISTORY: Past Medical History:  Diagnosis Date   Acute cystitis 07/22/2007   ALLERGIC RHINITIS 08/14/2006   [place] DISEASE, LUMBAR 08/07/2009   DIVERTICULITIS, ACUTE 06/07/2008   Extrinsic asthma, unspecified 03/30/2007   GERD 08/14/2006   HEPATITIS B, HX OF 08/14/2006   HYPERLIPIDEMIA 07/01/2008   HYPERTENSION 08/14/2006   OSTEOARTHRITIS 08/14/2006   PILAR CYST 07/20/2008   ROTATOR CUFF INJURY, RIGHT SHOULDER 11/11/2008    MEDICATIONS: Current Outpatient  Medications on File Prior to Visit  Medication Sig Dispense Refill   amoxicillin (AMOXIL) 500 MG capsule Take 500 mg by mouth 3 (three) times daily.     Aspirin-Acetaminophen-Caffeine (EXCEDRIN PO) Take 2 tablets by mouth 2  (two) times daily as needed (headache). (Patient not taking: No sig reported)     atorvastatin (LIPITOR) 40 MG tablet Take 40 mg by mouth daily with breakfast.     CALCIUM-MAGNESIUM-VITAMIN D PO Take 1 tablet by mouth every morning.     dextromethorphan-guaiFENesin (MUCINEX DM) 30-600 MG 12hr tablet Take 1 tablet by mouth 2 (two) times daily as needed for cough. (Patient not taking: Reported on 09/19/2020)     Levocetirizine Dihydrochloride (XYZAL PO) Take 1 tablet by mouth at bedtime.     magnesium oxide (MAG-OX) 400 MG tablet Take 400 mg by mouth at bedtime.     Melatonin 10 MG TABS Take 10 mg by mouth at bedtime.     Minoxidil (ROGAINE WOMENS) 5 % FOAM Apply 1 application topically daily.     nortriptyline (PAMELOR) 10 MG capsule Take 1 capsule (10 mg total) by mouth at bedtime. 90 capsule 1   Omega-3 Fatty Acids (FISH OIL) 1200 MG CAPS Take 1,200 mg by mouth every morning.     omeprazole (PRILOSEC) 20 MG capsule Take 20 mg by mouth every other day.     No current facility-administered medications on file prior to visit.    ALLERGIES: Allergies  Allergen Reactions   Nickel Itching   Bactrim [Sulfamethoxazole-Trimethoprim] Hives   Sulfa Antibiotics Rash    FAMILY HISTORY: Family History  Problem Relation Age of Onset   Hyperlipidemia Other    Hypertension Other    Anuerysm Mother 27       healthy weight, exercise and lifestyle    Heart failure Father 12   Cerebral aneurysm Brother       Objective:  Blood pressure 94/66, pulse 68, height 5\' 2"  (1.575 m), weight 146 lb 9.6 oz (66.5 kg), SpO2 100 %. General: No acute distress.  Patient appears well-groomed.   Head:  Normocephalic/atraumatic Eyes:  Fundi examined but not visualized Neck: supple, no paraspinal tenderness, full range of motion Heart:  Regular rate and rhythm Lungs:  Clear to auscultation bilaterally Back: No paraspinal tenderness Neurological Exam: alert and oriented to person, place, and time.  Speech fluent  and not dysarthric, language intact.  CN II-XII intact. Bulk and tone normal, muscle strength 5/5 throughout.  Sensation to light touch intact.  Deep tendon reflexes 2+ throughout, toes downgoing.  Finger to nose testing intact.  Gait normal, Romberg negative.   Metta Clines, DO  CC: Margretta Sidle, MD

## 2021-01-26 ENCOUNTER — Encounter: Payer: Self-pay | Admitting: Neurology

## 2021-01-26 ENCOUNTER — Other Ambulatory Visit: Payer: Self-pay

## 2021-01-26 ENCOUNTER — Ambulatory Visit (INDEPENDENT_AMBULATORY_CARE_PROVIDER_SITE_OTHER): Payer: Medicare Other | Admitting: Neurology

## 2021-01-26 VITALS — BP 94/66 | HR 68 | Ht 62.0 in | Wt 146.6 lb

## 2021-01-26 DIAGNOSIS — G43009 Migraine without aura, not intractable, without status migrainosus: Secondary | ICD-10-CM

## 2021-01-26 NOTE — Patient Instructions (Signed)
If headaches start recurring again, would restart nortriptyline.  Otherwise, follow up as needed.

## 2021-02-04 ENCOUNTER — Ambulatory Visit
Admission: RE | Admit: 2021-02-04 | Discharge: 2021-02-04 | Disposition: A | Discharge status: Home / Self Care | Payer: Medicare Other | Source: Ambulatory Visit | Attending: Family Medicine | Admitting: Family Medicine

## 2021-02-04 DIAGNOSIS — K769 Liver disease, unspecified: Secondary | ICD-10-CM

## 2021-02-04 IMAGING — MR MR ABDOMEN WO/W CM
10 of 17 series · 25 of 48 positions shown · IV contrast (13ml Multihance)
Comparison: Chest CTA on [DATE]

CLINICAL DATA: Indeterminate liver lesion and biliary ductal
dilatation on recent chest CTA.

EXAM:
MRI ABDOMEN WITHOUT AND WITH CONTRAST
TECHNIQUE: Multiplanar multisequence MR imaging of the abdomen was performed
both before and after the administration of intravenous contrast.
CONTRAST:  13mL MULTIHANCE GADOBENATE DIMEGLUMINE 529 MG/ML IV SOLN

[Series 5: cor haste repeat · coronal · 5.5mm · 0.78mm/px · 2 of 32 slices shown]
[im 1/32]
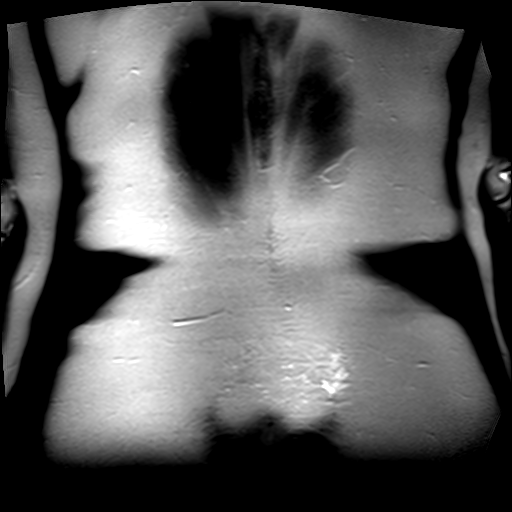
[im 32/32]
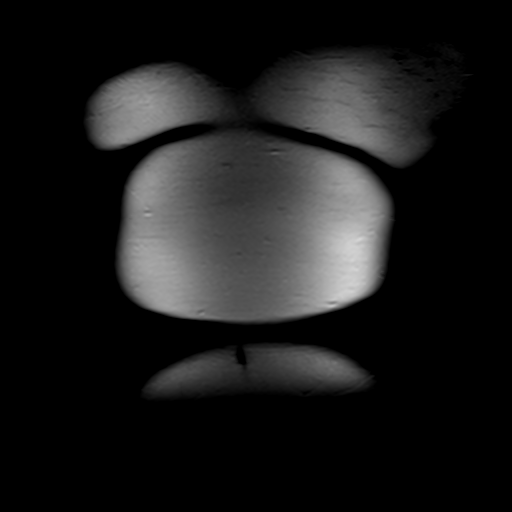

[Series 6: axial haste · axial · 6.6mm · 0.88mm/px · z∈[-67,+143]mm · 2 of 30 slices shown]
[im 1/30]
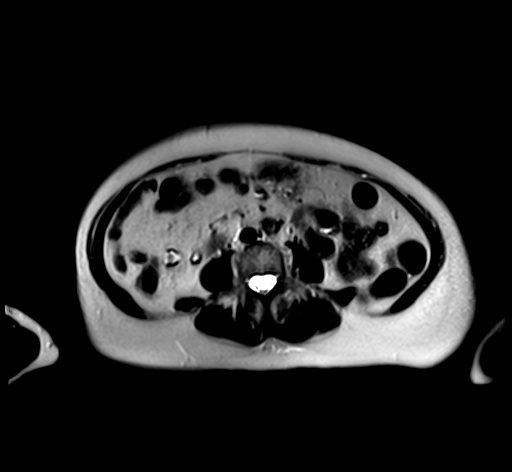
[im 30/30]
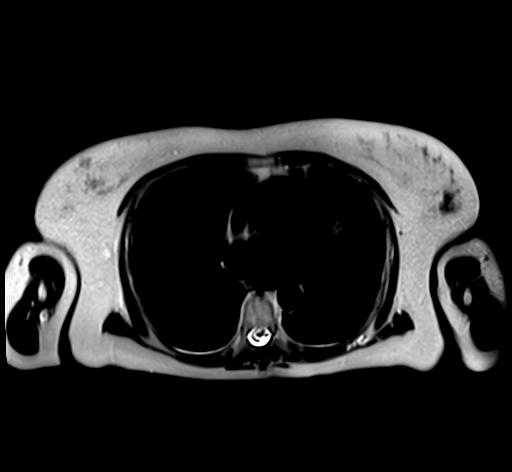

[Series 7: T1 · axial · 6.5mm · 0.88mm/px · z∈[-66,+142]mm · 4 of 60 slices shown]
[im 1/60]
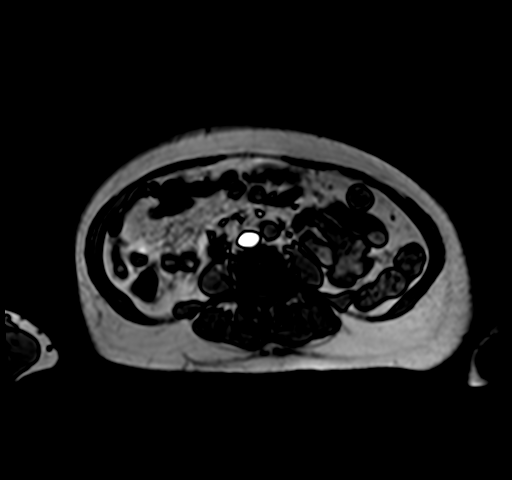
[im 20/60]
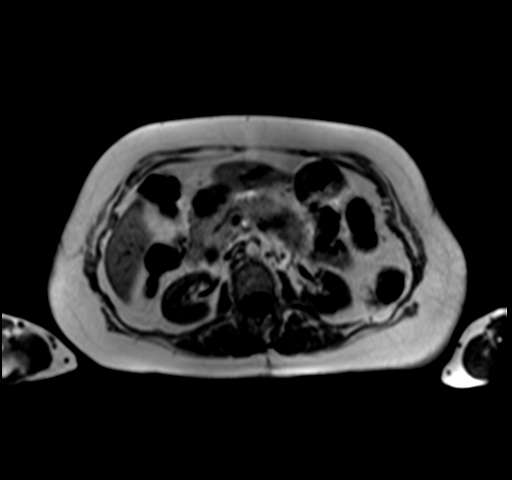
[im 40/60]
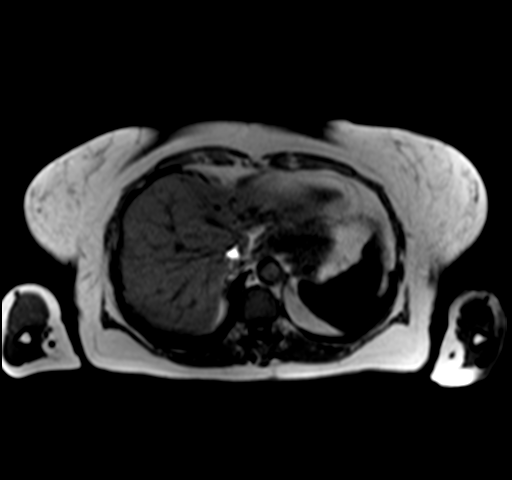
[im 60/60]
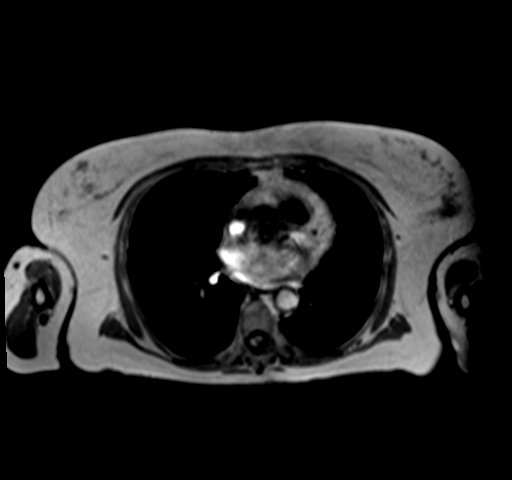

[Series 8: bSSFP · axial · 4.5mm · 0.88mm/px · z∈[-72,+148]mm · 3 of 50 slices shown]
[im 1/50]
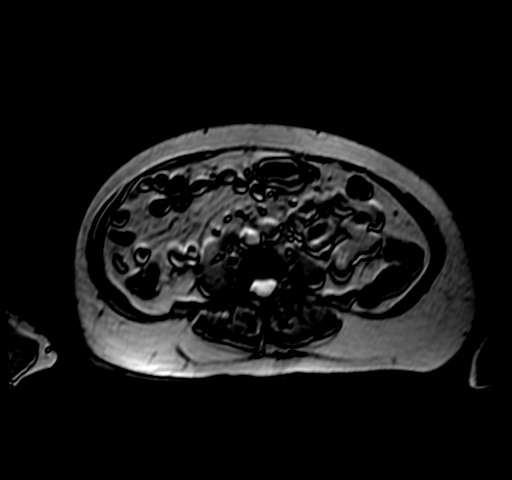
[im 25/50]
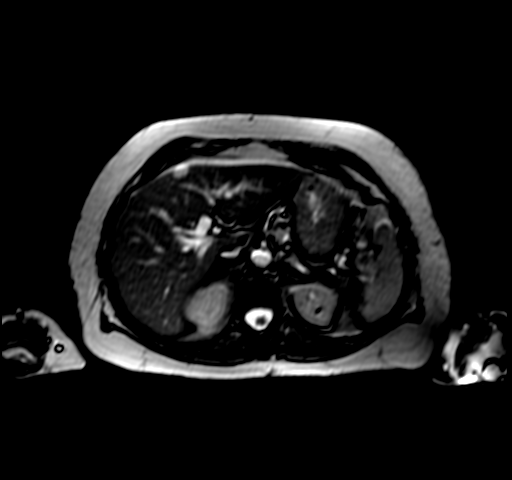
[im 50/50]
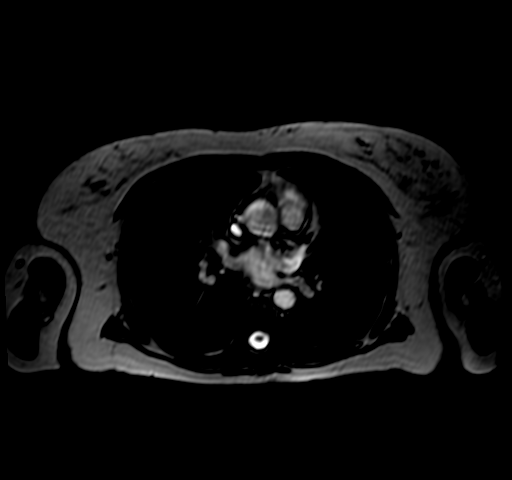

[Series 9: T2 fat-sat · axial · 6.5mm · 1.41mm/px · z∈[-73,+149]mm · 2 of 32 slices shown]
[im 1/32]
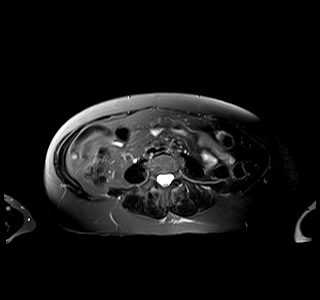
[im 32/32]
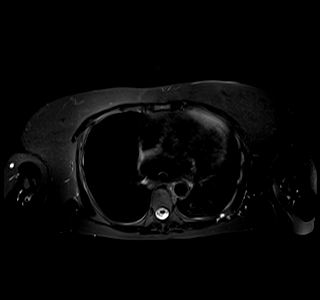

[Series 10: [id number]_500_[id number]_trig · axial · 6.5mm · 2.34mm/px · z∈[-73,+149]mm · 4 of 96 slices shown]
[im 1/96]
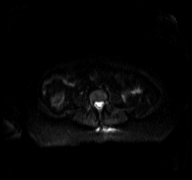
[im 32/96]
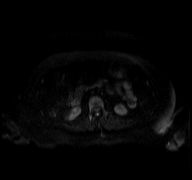
[im 64/96]
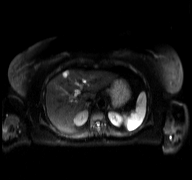
[im 96/96]
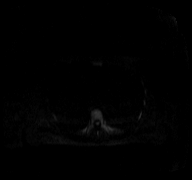

[Series 11: [id number]_trig_adc · axial · 6.5mm · 2.34mm/px · 1 of 32 slices shown]
[im 1/32]
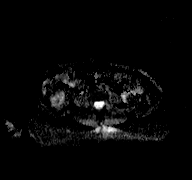

[Series 12: T1 dynamic · axial · non-contrast · 3.0mm · 0.88mm/px · z∈[-67,+146]mm · 3 of 72 slices shown]
[im 1/72]
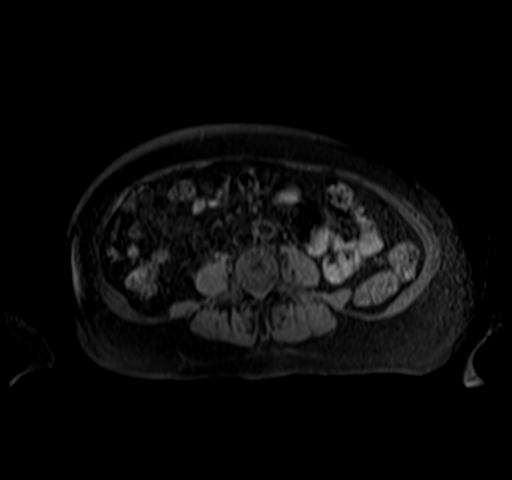
[im 36/72]
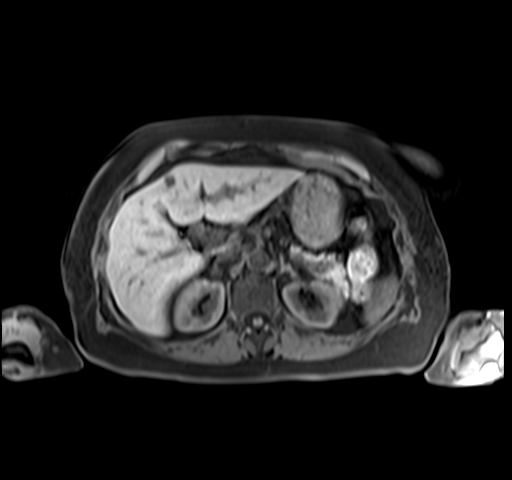
[im 72/72]
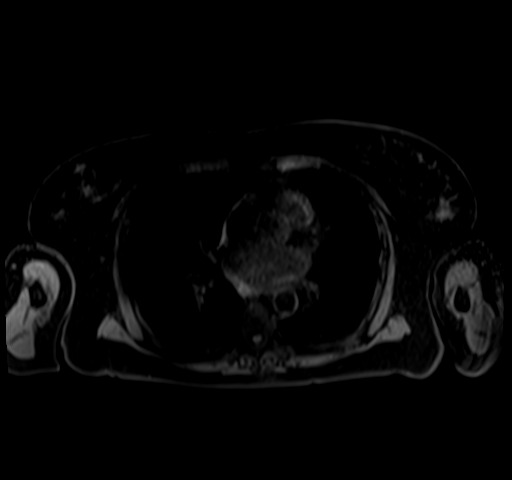

[Series 13: T1 dynamic post-contrast · axial · 3.0mm · 0.88mm/px · z∈[-67,+146]mm · 3 of 72 slices shown (1 of 2)]
[im 1/72]
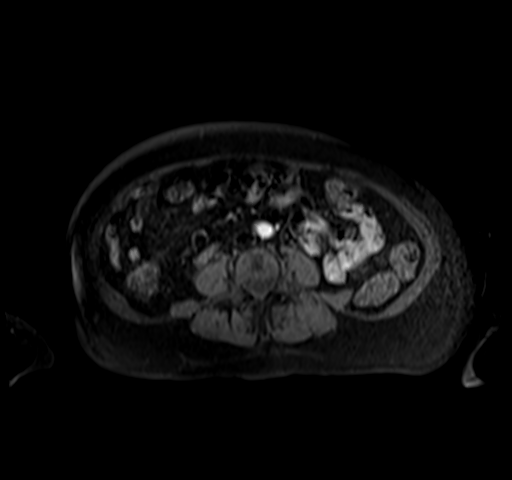
[im 36/72]
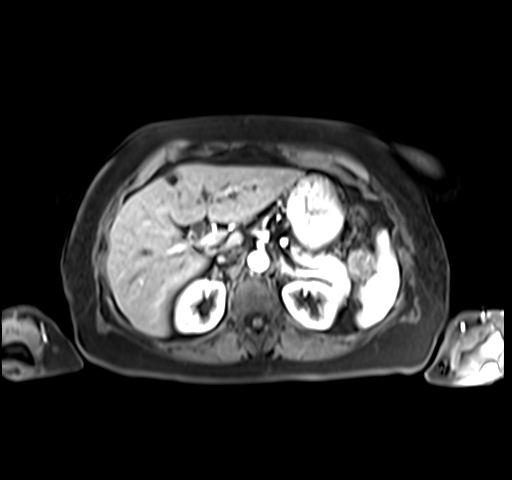
[im 72/72]
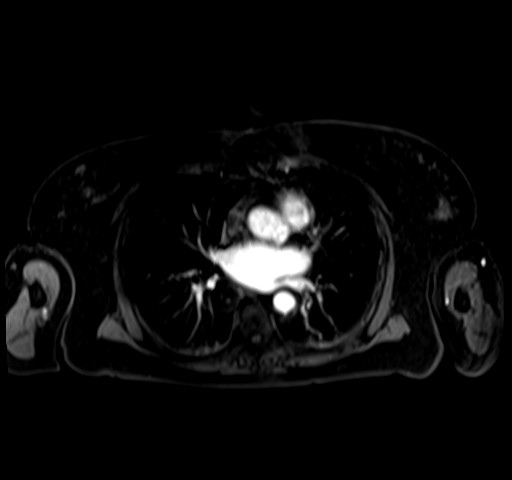

[Series 14: T1 dynamic post-contrast · axial · 3.0mm · 0.88mm/px · 1 of 72 slices shown (2 of 2)]
[im 1/72]
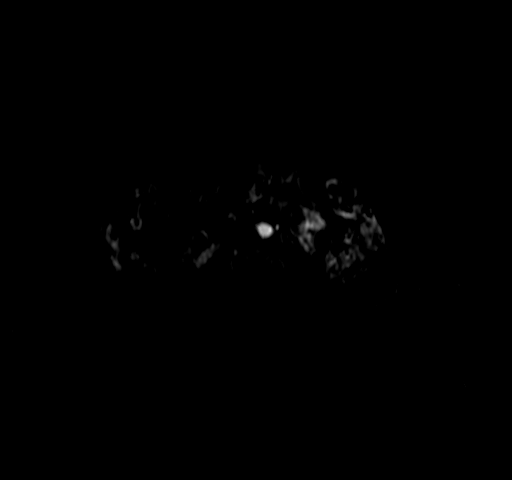

[25 of 48 positions shown; findings below may reference images not displayed]

FINDINGS: Lower chest: No acute findings.

Hepatobiliary: 1.6 cm benign-appearing cyst is seen in segment 4 B
of the left lobe. No hepatic masses identified. Prior
cholecystectomy noted. Diffuse intra and extrahepatic biliary ductal
dilatation is seen, with common bile duct measuring 12 mm. No
evidence of choledocholithiasis or biliary stricture.

Pancreas: 8 mm cystic lesion is seen in the pancreatic body on image
[DATE]. No evidence of pancreatic mass. Mild diffuse pancreatic ductal
dilatation is seen. Pancreas divisum is also noted. No evidence of
pancreatic edema or peripancreatic inflammatory changes.

Spleen:  Within normal limits in size and appearance.

Adrenals/Urinary Tract: No masses identified. A few tiny bilateral
sub-cm renal cysts are noted. No evidence of hydronephrosis.

Stomach/Bowel: Visualized portion unremarkable.

Vascular/Lymphatic: No pathologically enlarged lymph nodes
identified. No acute vascular findings.

Other:  None.

Musculoskeletal:  No suspicious bone lesions identified.
IMPRESSION: Small benign left hepatic lobe cyst, which correlates with lesion
seen on prior CT. No evidence of hepatic neoplasm.

Diffuse biliary ductal dilatation, with common bile duct measuring
12 mm. No radiographic evidence of choledocholithiasis or other
obstructing etiology.

8 mm cystic lesion in the pancreatic body, suspicious for indolent
cystic neoplasm such as a side-branch IPMN. Recommend continued
follow-up by MRI in 2 years. This recommendation follows ACR
consensus guidelines: Management of Incidental Pancreatic Cysts: A
White Paper of the ACR Incidental Findings Committee. [HOSPITAL] [UV];[DATE].

Pancreas divisum.  No evidence of pancreatic ductal dilatation.

## 2021-02-04 MED ORDER — GADOBENATE DIMEGLUMINE 529 MG/ML IV SOLN
13.0000 mL | Freq: Once | INTRAVENOUS | Status: AC | PRN
  Administered 2021-02-04: 10:00:00 13 mL via INTRAVENOUS

## 2021-12-25 ENCOUNTER — Other Ambulatory Visit: Payer: Self-pay | Admitting: Internal Medicine

## 2021-12-25 DIAGNOSIS — K869 Disease of pancreas, unspecified: Secondary | ICD-10-CM

## 2021-12-25 DIAGNOSIS — K769 Liver disease, unspecified: Secondary | ICD-10-CM

## 2022-11-27 ENCOUNTER — Other Ambulatory Visit: Payer: Self-pay | Admitting: Adult Health

## 2022-11-27 DIAGNOSIS — K862 Cyst of pancreas: Secondary | ICD-10-CM

## 2022-12-20 ENCOUNTER — Other Ambulatory Visit: Payer: Medicare Other

## 2023-02-04 ENCOUNTER — Ambulatory Visit
Admission: RE | Admit: 2023-02-04 | Discharge: 2023-02-04 | Disposition: A | Discharge status: Home / Self Care | Payer: Medicare Other | Source: Ambulatory Visit | Attending: Adult Health | Admitting: Adult Health

## 2023-02-04 DIAGNOSIS — K862 Cyst of pancreas: Secondary | ICD-10-CM

## 2023-02-04 MED ORDER — GADOPICLENOL 0.5 MMOL/ML IV SOLN
7.0000 mL | Freq: Once | INTRAVENOUS | Status: AC | PRN
  Administered 2023-02-04: 7 mL via INTRAVENOUS
